# Patient Record
Sex: Female | Born: 1940 | Race: White | Hispanic: No | Marital: Single
Health system: Southern US, Community
[De-identification: ages and names within clinical notes are randomized; demographics above are authoritative.]

---

## 2021-01-05 ENCOUNTER — Observation Stay (HOSPITAL_COMMUNITY)
Admission: EM | Admit: 2021-01-05 | Discharge: 2021-01-06 | Disposition: A | Discharge status: Home / Self Care | Payer: Medicare Other | Attending: Internal Medicine | Admitting: Internal Medicine

## 2021-01-05 ENCOUNTER — Emergency Department (HOSPITAL_COMMUNITY): Payer: Medicare Other

## 2021-01-05 ENCOUNTER — Encounter (HOSPITAL_COMMUNITY): Payer: Self-pay

## 2021-01-05 DIAGNOSIS — W010XXA Fall on same level from slipping, tripping and stumbling without subsequent striking against object, initial encounter: Secondary | ICD-10-CM | POA: Insufficient documentation

## 2021-01-05 DIAGNOSIS — E039 Hypothyroidism, unspecified: Secondary | ICD-10-CM | POA: Diagnosis not present

## 2021-01-05 DIAGNOSIS — Y900 Blood alcohol level of less than 20 mg/100 ml: Secondary | ICD-10-CM | POA: Diagnosis not present

## 2021-01-05 DIAGNOSIS — K915 Postcholecystectomy syndrome: Secondary | ICD-10-CM | POA: Diagnosis not present

## 2021-01-05 DIAGNOSIS — S06369A Traumatic hemorrhage of cerebrum, unspecified, with loss of consciousness of unspecified duration, initial encounter: Secondary | ICD-10-CM | POA: Diagnosis not present

## 2021-01-05 DIAGNOSIS — Z515 Encounter for palliative care: Secondary | ICD-10-CM

## 2021-01-05 DIAGNOSIS — R402111 Coma scale, eyes open, never, in the field [EMT or ambulance]: Secondary | ICD-10-CM | POA: Diagnosis present

## 2021-01-05 DIAGNOSIS — S06349A Traumatic hemorrhage of right cerebrum with loss of consciousness of unspecified duration, initial encounter: Secondary | ICD-10-CM

## 2021-01-05 DIAGNOSIS — S0633AA Contusion and laceration of cerebrum, unspecified, with loss of consciousness status unknown, initial encounter: Secondary | ICD-10-CM

## 2021-01-05 DIAGNOSIS — N3941 Urge incontinence: Secondary | ICD-10-CM | POA: Insufficient documentation

## 2021-01-05 DIAGNOSIS — J9811 Atelectasis: Secondary | ICD-10-CM | POA: Diagnosis not present

## 2021-01-05 DIAGNOSIS — S065X9A Traumatic subdural hemorrhage with loss of consciousness of unspecified duration, initial encounter: Secondary | ICD-10-CM

## 2021-01-05 DIAGNOSIS — Z20822 Contact with and (suspected) exposure to covid-19: Secondary | ICD-10-CM | POA: Insufficient documentation

## 2021-01-05 DIAGNOSIS — S06A1XA Traumatic brain compression with herniation, initial encounter: Secondary | ICD-10-CM | POA: Insufficient documentation

## 2021-01-05 DIAGNOSIS — I609 Nontraumatic subarachnoid hemorrhage, unspecified: Secondary | ICD-10-CM

## 2021-01-05 DIAGNOSIS — S065XAA Traumatic subdural hemorrhage with loss of consciousness status unknown, initial encounter: Secondary | ICD-10-CM

## 2021-01-05 DIAGNOSIS — S06360A Traumatic hemorrhage of cerebrum, unspecified, without loss of consciousness, initial encounter: Secondary | ICD-10-CM

## 2021-01-05 DIAGNOSIS — T1490XA Injury, unspecified, initial encounter: Secondary | ICD-10-CM

## 2021-01-05 DIAGNOSIS — I629 Nontraumatic intracranial hemorrhage, unspecified: Secondary | ICD-10-CM | POA: Diagnosis not present

## 2021-01-05 DIAGNOSIS — S0990XA Unspecified injury of head, initial encounter: Secondary | ICD-10-CM | POA: Diagnosis present

## 2021-01-05 DIAGNOSIS — W19XXXA Unspecified fall, initial encounter: Secondary | ICD-10-CM

## 2021-01-05 DIAGNOSIS — S06319A Contusion and laceration of right cerebrum with loss of consciousness of unspecified duration, initial encounter: Secondary | ICD-10-CM

## 2021-01-05 LAB — I-STAT CHEM 8, ED
BUN: 18 mg/dL (ref 8–23)
Calcium, Ion: 0.93 mmol/L — ABNORMAL LOW (ref 1.15–1.40)
Chloride: 102 mmol/L (ref 98–111)
Creatinine, Ser: 0.7 mg/dL (ref 0.44–1.00)
Glucose, Bld: 297 mg/dL — ABNORMAL HIGH (ref 70–99)
HCT: 35 % — ABNORMAL LOW (ref 36.0–46.0)
Hemoglobin: 11.9 g/dL — ABNORMAL LOW (ref 12.0–15.0)
Potassium: 4.2 mmol/L (ref 3.5–5.1)
Sodium: 131 mmol/L — ABNORMAL LOW (ref 135–145)
TCO2: 18 mmol/L — ABNORMAL LOW (ref 22–32)

## 2021-01-05 LAB — CBC
HCT: 36.1 % (ref 36.0–46.0)
Hemoglobin: 12 g/dL (ref 12.0–15.0)
MCH: 31.8 pg (ref 26.0–34.0)
MCHC: 33.2 g/dL (ref 30.0–36.0)
MCV: 95.8 fL (ref 80.0–100.0)
Platelets: 343 10*3/uL (ref 150–400)
RBC: 3.77 MIL/uL — ABNORMAL LOW (ref 3.87–5.11)
RDW: 13.2 % (ref 11.5–15.5)
WBC: 18.1 10*3/uL — ABNORMAL HIGH (ref 4.0–10.5)
nRBC: 0 % (ref 0.0–0.2)

## 2021-01-05 LAB — COMPREHENSIVE METABOLIC PANEL
ALT: 37 U/L (ref 0–44)
AST: 55 U/L — ABNORMAL HIGH (ref 15–41)
Albumin: 3.5 g/dL (ref 3.5–5.0)
Alkaline Phosphatase: 51 U/L (ref 38–126)
Anion gap: 15 (ref 5–15)
BUN: 17 mg/dL (ref 8–23)
CO2: 18 mmol/L — ABNORMAL LOW (ref 22–32)
Calcium: 8.1 mg/dL — ABNORMAL LOW (ref 8.9–10.3)
Chloride: 98 mmol/L (ref 98–111)
Creatinine, Ser: 1.01 mg/dL — ABNORMAL HIGH (ref 0.44–1.00)
GFR, Estimated: 56 mL/min — ABNORMAL LOW (ref >60)
Glucose, Bld: 294 mg/dL — ABNORMAL HIGH (ref 70–99)
Potassium: 4.2 mmol/L (ref 3.5–5.1)
Sodium: 131 mmol/L — ABNORMAL LOW (ref 135–145)
Total Bilirubin: 0.8 mg/dL (ref 0.3–1.2)
Total Protein: 6.2 g/dL — ABNORMAL LOW (ref 6.5–8.1)

## 2021-01-05 LAB — RESP PANEL BY RT-PCR (FLU A&B, COVID) ARPGX2
Influenza A by PCR: NEGATIVE
Influenza B by PCR: NEGATIVE
SARS Coronavirus 2 by RT PCR: NEGATIVE

## 2021-01-05 LAB — I-STAT ARTERIAL BLOOD GAS, ED
Acid-base deficit: 5 mmol/L — ABNORMAL HIGH (ref 0.0–2.0)
Bicarbonate: 18.2 mmol/L — ABNORMAL LOW (ref 20.0–28.0)
Calcium, Ion: 1.07 mmol/L — ABNORMAL LOW (ref 1.15–1.40)
HCT: 28 % — ABNORMAL LOW (ref 36.0–46.0)
Hemoglobin: 9.5 g/dL — ABNORMAL LOW (ref 12.0–15.0)
O2 Saturation: 100 %
Patient temperature: 101.6
Potassium: 3.8 mmol/L (ref 3.5–5.1)
Sodium: 131 mmol/L — ABNORMAL LOW (ref 135–145)
TCO2: 19 mmol/L — ABNORMAL LOW (ref 22–32)
pCO2 arterial: 27.7 mmHg — ABNORMAL LOW (ref 32.0–48.0)
pH, Arterial: 7.432 (ref 7.350–7.450)
pO2, Arterial: 226 mmHg — ABNORMAL HIGH (ref 83.0–108.0)

## 2021-01-05 LAB — SAMPLE TO BLOOD BANK

## 2021-01-05 LAB — ETHANOL: Alcohol, Ethyl (B): <10 mg/dL (ref <10)

## 2021-01-05 LAB — LACTIC ACID, PLASMA: Lactic Acid, Venous: 4.5 mmol/L (ref 0.5–1.9)

## 2021-01-05 LAB — PROTIME-INR
INR: 1.2 (ref 0.8–1.2)
Prothrombin Time: 15.5 seconds — ABNORMAL HIGH (ref 11.4–15.2)

## 2021-01-05 MED ORDER — HYDROMORPHONE BOLUS VIA INFUSION
0.5000 mg | INTRAVENOUS | Status: DC | PRN
  Filled 2021-01-05: qty 1

## 2021-01-05 MED ORDER — SODIUM CHLORIDE 0.9 % IV SOLN
1.0000 mg/h | INTRAVENOUS | Status: DC
  Administered 2021-01-05: 1 mg/h via INTRAVENOUS
  Filled 2021-01-05: qty 5

## 2021-01-05 MED ORDER — LEVETIRACETAM IN NACL 1500 MG/100ML IV SOLN
1500.0000 mg | Freq: Once | INTRAVENOUS | Status: AC
  Administered 2021-01-05: 1500 mg via INTRAVENOUS
  Filled 2021-01-05: qty 100

## 2021-01-05 MED ORDER — DEXAMETHASONE SODIUM PHOSPHATE 10 MG/ML IJ SOLN
10.0000 mg | Freq: Once | INTRAMUSCULAR | Status: DC
  Filled 2021-01-05: qty 1

## 2021-01-05 MED ORDER — BIOTENE DRY MOUTH MT LIQD: 15.0000 mL | OROMUCOSAL | Status: DC | PRN

## 2021-01-05 MED ORDER — PROPOFOL 1000 MG/100ML IV EMUL
0.0000 ug/kg/min | INTRAVENOUS | Status: DC
Start: 2021-01-05 — End: 2021-01-05
  Administered 2021-01-05: 20 ug/kg/min via INTRAVENOUS

## 2021-01-05 MED ORDER — CLEVIDIPINE BUTYRATE 0.5 MG/ML IV EMUL
0.0000 mg/h | INTRAVENOUS | Status: DC
  Administered 2021-01-05: 2 mg/h via INTRAVENOUS
  Filled 2021-01-05: qty 50

## 2021-01-05 MED ORDER — PROPOFOL 1000 MG/100ML IV EMUL
INTRAVENOUS | Status: AC
  Filled 2021-01-05: qty 100

## 2021-01-05 MED ORDER — PROPOFOL 1000 MG/100ML IV EMUL
INTRAVENOUS | Status: DC | PRN
  Administered 2021-01-05: 20 ug/kg/min via INTRAVENOUS

## 2021-01-05 MED ORDER — ETOMIDATE 2 MG/ML IV SOLN
INTRAVENOUS | Status: AC | PRN
  Administered 2021-01-05: 20 mg via INTRAVENOUS

## 2021-01-05 MED ORDER — GLYCOPYRROLATE 1 MG PO TABS
1.0000 mg | ORAL_TABLET | ORAL | Status: DC | PRN
  Filled 2021-01-05: qty 1

## 2021-01-05 MED ORDER — GLYCOPYRROLATE 0.2 MG/ML IJ SOLN: 0.2000 mg | INTRAMUSCULAR | Status: DC | PRN

## 2021-01-05 MED ORDER — IOHEXOL 300 MG/ML  SOLN
100.0000 mL | Freq: Once | INTRAMUSCULAR | Status: AC | PRN
  Administered 2021-01-05: 100 mL via INTRAVENOUS

## 2021-01-05 MED ORDER — SUCCINYLCHOLINE CHLORIDE 20 MG/ML IJ SOLN
INTRAMUSCULAR | Status: AC | PRN
  Administered 2021-01-05: 80 mg via INTRAVENOUS

## 2021-01-05 NOTE — Progress Notes (Signed)
Pt. Extubated to comfort care per MD order.

## 2021-01-05 NOTE — ED Notes (Signed)
Transition of Care [facility]) - CAGE-AID Screening   Patient Details  Name: Terri Jacobson MRN: [id number] Date of Birth: [birth year]       CAGE-AID Screening: Substance Abuse Screening unable to be completed due to: : Patient unable to participate (pt unable to participate due to medical condition)

## 2021-01-05 NOTE — ED Provider Notes (Signed)
Emergency Department Provider Note   I have reviewed the triage vital signs and the nursing notes.   HISTORY  Chief Complaint Fall   HPI Terri Jacobson is a 80 y.o. female arrives to the emergency department as a level 1 trauma activated from the field.  Patient reportedly had a mechanical fall witnessed by neighbors today at 1 PM.  She refused transport to the hospital and went back inside.  Her son apparently came to check on her later in the day and found her on the floor and minimally responsive.  EMS arrived on scene to find the patient with GCS of 3, unequal pupils, and periorbital ecchymosis.  She was not responding to pain but did have spontaneous respirations.  She was placed on a nonrebreather and brought emergently to the emergency department. Unknown anticoagulation status.   Level 5 caveat: Unresponsive.   History reviewed. No pertinent past medical history.  Patient Active Problem List   Diagnosis Date Noted   Intracranial hemorrhage (HCC) 14-Jan-2021    History reviewed. No pertinent surgical history.  Allergies Patient has no known allergies.  No family history on file.  Social History    Review of Systems  Level 5 caveat: Unresponsive.   ____________________________________________   PHYSICAL EXAM:  VITAL SIGNS: ED Triage Vitals  Enc Vitals Group     BP 2021-01-14 2024 (!) 188/100     Pulse Rate Jan 14, 2021 2029 (!) 114     Resp 2021/01/14 2029 19     Temp 2021/01/14 2031 99.9 F (37.7 C)     Temp src --      SpO2 01/14/21 2029 98 %     Weight 01/14/21 2030 121 lb 4.1 oz (55 kg)     Height 01/14/21 2030  (1.626 m)   Constitutional: Unresponsive to painful stimulus.  Eyes: Conjunctivae are normal. Pupils are unequal. 4 mm on the right and 2 mm on the left (sluggish).  Bilateral periorbital ecchymosis and swelling noted.  Head: No appreciable lacerations but a large left occipital scalp hematoma noted.  Nose: No  congestion/rhinnorhea. Mouth/Throat: Mucous membranes are moist.  Oropharynx non-erythematous. No bleeding. No dentures.  Neck: C collar in place.  Cardiovascular: Tachycarida. Good peripheral circulation. Grossly normal heart sounds.   Respiratory: Agonal respiratory effort.  No retractions. Lungs CTAB. Gastrointestinal: Soft. No distention. Stable pelvis.  Musculoskeletal: No gross deformities of extremities. Neurologic: Unresponsive. GCS 3.  Skin:  Skin is warm and dry. Abrasion to the left shoulder and left forearm. Wounds are hemostatic.    ____________________________________________   LABS (all labs ordered are listed, but only abnormal results are displayed)  Labs Reviewed  COMPREHENSIVE METABOLIC PANEL - Abnormal; Notable for the following components:      Result Value   Sodium 131 (*)    CO2 18 (*)    Glucose, Bld 294 (*)    Creatinine, Ser 1.01 (*)    Calcium 8.1 (*)    Total Protein 6.2 (*)    AST 55 (*)    GFR, Estimated 56 (*)    All other components within normal limits  CBC - Abnormal; Notable for the following components:   WBC 18.1 (*)    RBC 3.77 (*)    All other components within normal limits  LACTIC ACID, PLASMA - Abnormal; Notable for the following components:   Lactic Acid, Venous 4.5 (*)    All other components within normal limits  PROTIME-INR - Abnormal; Notable for the following components:   Prothrombin  Time 15.5 (*)    All other components within normal limits  I-STAT CHEM 8, ED - Abnormal; Notable for the following components:   Sodium 131 (*)    Glucose, Bld 297 (*)    Calcium, Ion 0.93 (*)    TCO2 18 (*)    Hemoglobin 11.9 (*)    HCT 35.0 (*)    All other components within normal limits  I-STAT ARTERIAL BLOOD GAS, ED - Abnormal; Notable for the following components:   pCO2 arterial 27.7 (*)    pO2, Arterial 226 (*)    Bicarbonate 18.2 (*)    TCO2 19 (*)    Acid-base deficit 5.0 (*)    Sodium 131 (*)    Calcium, Ion 1.07 (*)    HCT  28.0 (*)    Hemoglobin 9.5 (*)    All other components within normal limits  RESP PANEL BY RT-PCR (FLU A&B, COVID) ARPGX2  ETHANOL  URINALYSIS, ROUTINE W REFLEX MICROSCOPIC  TRIGLYCERIDES  SAMPLE TO BLOOD BANK   ____________________________________________  RADIOLOGY  CT HEAD WO CONTRAST  Result Date: 01-14-2021 CLINICAL DATA:  Polytrauma, critical, head/C-spine injury suspected. Fall, head injury, found down, unresponsive EXAM: CT HEAD WITHOUT CONTRAST CT MAXILLOFACIAL WITHOUT CONTRAST CT CERVICAL SPINE WITHOUT CONTRAST CT CHEST, ABDOMEN AND PELVIS WITH CONTRAST TECHNIQUE: Contiguous axial images were obtained from the base of the skull through the vertex without intravenous contrast. Multidetector CT imaging of the maxillofacial structures was performed. Multiplanar CT image reconstructions were also generated. A small metallic BB was placed on the right temple in order to reliably differentiate right from left. Multidetector CT imaging of the cervical spine was performed without intravenous contrast. Multiplanar CT image reconstructions were also generated. Multidetector CT imaging of the chest, abdomen and pelvis was performed following the standard protocol during bolus administration of intravenous contrast. CONTRAST:  OMNIPAQUE IOHEXOL 300 MG/ML  SOLN COMPARISON:  None. FINDINGS: CT HEAD FINDINGS Brain: There is a large right intraparenchymal hematoma involving the a right temporal lobe anteriorly measuring at least 7.2 x 4.3 x 5.1 cm demonstrating significant mass effect with complete effacement of the right lateral ventricle, 14 mm right to left midline shift, and herniation of the uncus below the tentorium, best appreciated on coronal image # 37. There is significant mass effect upon the midbrain, best appreciated on axial image # 17/3. Smaller 12 mm intraparenchymal hematoma is seen involving the anterior pole of the left temporal lobe. There is extensive subarachnoid hemorrhage noted  bilaterally within the sulci of the cerebral hemispheres. Bilateral subdural hematoma overlie the cerebral convexities. On the right, this measures up to 13 mm in thickness (coronal image # 24/5), layers along the right tentorium, and demonstrates additional mass effect upon the right cerebral hemisphere. On the left, this is relatively thin measuring up to 3 mm in thickness. Left lateral ventricle is of normal size. There is marked mass effect with near complete effacement of the third ventricle. Fourth ventricle is unremarkable. Complete effacement of the suprasellar cistern. Cerebellum is unremarkable. Vascular: No asymmetric hyperdense vasculature at the skull base. Skull: The calvarium is intact. Other: There is a a moderate left parietal scalp hematoma noted. Mastoid air cells and middle ear cavities are clear. CT MAXILLOFACIAL FINDINGS Osseous: No acute facial fracture.  No mandibular dislocation. Orbits: The ocular globes are intact. The ocular lenses are removed bilaterally. The retro-orbital fat is preserved. Extraocular musculature and optic nerves appear normal. There is moderate left preseptal soft tissue swelling. Sinuses: There is  layering high attenuation fluid within the left maxillary sinus and sphenoid sinuses in keeping with blood. Soft tissues: Unremarkable. CT CERVICAL SPINE FINDINGS Alignment: Normal.  No listhesis. Skull base and vertebrae: Craniocervical alignment is normal. The atlantodental interval is not widened. There is no acute fracture of the cervical spine. Vertebral body height is preserved. Soft tissues and spinal canal: No prevertebral fluid or swelling. No visible canal hematoma. Disc levels: There is intervertebral disc space narrowing and endplate remodeling at C5-C7 in keeping with changes of moderate degenerative disc disease. Remaining intervertebral disc heights are preserved. The prevertebral soft tissues are not thickened on sagittal reformats. Multilevel uncovertebral  and facet arthrosis results in multilevel mild-to-moderate neuroforaminal narrowing, most severe on the left at C4-5 and bilaterally at C5-6 Other: None CT CHEST FINDINGS Cardiovascular: No significant coronary artery calcification. Global cardiac size within normal limits. No pericardial effusion. Central pulmonary arteries are of normal caliber. Thoracic aorta is unremarkable. Mediastinum/Nodes: Endotracheal tube in expected position within the distal trachea. Nasogastric tube extends into the mid body of the stomach. No pathologic adenopathy within the thorax. No mediastinal hematoma. Esophagus unremarkable. Lungs/Pleura: Mild bibasilar atelectasis. Lungs are otherwise clear. No pneumothorax or pleural effusion. Musculoskeletal: Degenerative changes noted within the shoulders. Multiple healed right rib fractures noted. No acute bone abnormality within the thorax. CT ABDOMEN AND PELVIS FINDINGS Hepatobiliary: Status post cholecystectomy. Moderate intra and extrahepatic biliary ductal dilation may represent post cholecystectomy change, and is stable since remote prior examination of 10/15/2019. Liver unremarkable. Pancreas: Unremarkable Spleen: Unremarkable Adrenals/Urinary Tract: Adrenal glands are unremarkable. Kidneys are normal, without renal calculi, focal lesion, or hydronephrosis. Bladder is unremarkable. Stomach/Bowel: The stomach, small bowel, and large bowel are unremarkable. No free intraperitoneal gas or fluid. Vascular/Lymphatic: Retroaortic left renal vein. Mild atherosclerotic calcification within the abdominal aorta. No aortic aneurysm. No pathologic adenopathy within the abdomen and pelvis. Reproductive: Uterus and bilateral adnexa are unremarkable. Other: No abdominal wall hernia Musculoskeletal: Degenerative changes are seen within the lumbar spine. Remote compression fracture of L2 status post vertebroplasty noted. No acute bone abnormality within the abdomen and pelvis. IMPRESSION: Probable  Coup/contrecoup type injury with direct impact and moderate hematoma involving the left parietal scalp. Large intraparenchymal hematoma within the right temporal lobe measuring up to 7.2 cm in size demonstrating marked mass effect with 14 mm right to left midline shift, herniation of the uncus with effacement of the suprasellar cistern and marked mass effect upon the midbrain, complete effacement of the right lateral ventricle and near complete effacement of the third ventricle. Superimposed bilateral subdural hematoma measuring up to 13 mm in thickness on the right contributing to mass effect upon the right cerebral hemisphere. Small left subdural hematoma measures up to 3 mm in thickness without associated significant mass effect. 12 mm intraparenchymal hematoma within the anterior pole of the left temporal lobe. Extensive subarachnoid hemorrhage. Layering hemorrhage within the paranasal sinuses. No acute facial fracture identified, however. No acute fracture or listhesis of the cervical spine. No acute intrathoracic or intra-abdominal injury. These results were called by telephone at the time of interpretation on 01/18/2021 at 9:20 pm to provider Amiir Heckard , who verbally acknowledged these results. Electronically Signed   By: Helyn Numbers MD   On: 01/14/2021 21:39   CT CHEST W CONTRAST  Result Date: 01/28/2021 CLINICAL DATA:  Polytrauma, critical, head/C-spine injury suspected. Fall, head injury, found down, unresponsive EXAM: CT HEAD WITHOUT CONTRAST CT MAXILLOFACIAL WITHOUT CONTRAST CT CERVICAL SPINE WITHOUT CONTRAST CT CHEST, ABDOMEN AND PELVIS  WITH CONTRAST TECHNIQUE: Contiguous axial images were obtained from the base of the skull through the vertex without intravenous contrast. Multidetector CT imaging of the maxillofacial structures was performed. Multiplanar CT image reconstructions were also generated. A small metallic BB was placed on the right temple in order to reliably differentiate right from  left. Multidetector CT imaging of the cervical spine was performed without intravenous contrast. Multiplanar CT image reconstructions were also generated. Multidetector CT imaging of the chest, abdomen and pelvis was performed following the standard protocol during bolus administration of intravenous contrast. CONTRAST:  OMNIPAQUE IOHEXOL 300 MG/ML  SOLN COMPARISON:  None. FINDINGS: CT HEAD FINDINGS Brain: There is a large right intraparenchymal hematoma involving the a right temporal lobe anteriorly measuring at least 7.2 x 4.3 x 5.1 cm demonstrating significant mass effect with complete effacement of the right lateral ventricle, 14 mm right to left midline shift, and herniation of the uncus below the tentorium, best appreciated on coronal image # 37. There is significant mass effect upon the midbrain, best appreciated on axial image # 17/3. Smaller 12 mm intraparenchymal hematoma is seen involving the anterior pole of the left temporal lobe. There is extensive subarachnoid hemorrhage noted bilaterally within the sulci of the cerebral hemispheres. Bilateral subdural hematoma overlie the cerebral convexities. On the right, this measures up to 13 mm in thickness (coronal image # 24/5), layers along the right tentorium, and demonstrates additional mass effect upon the right cerebral hemisphere. On the left, this is relatively thin measuring up to 3 mm in thickness. Left lateral ventricle is of normal size. There is marked mass effect with near complete effacement of the third ventricle. Fourth ventricle is unremarkable. Complete effacement of the suprasellar cistern. Cerebellum is unremarkable. Vascular: No asymmetric hyperdense vasculature at the skull base. Skull: The calvarium is intact. Other: There is a a moderate left parietal scalp hematoma noted. Mastoid air cells and middle ear cavities are clear. CT MAXILLOFACIAL FINDINGS Osseous: No acute facial fracture.  No mandibular dislocation. Orbits: The ocular  globes are intact. The ocular lenses are removed bilaterally. The retro-orbital fat is preserved. Extraocular musculature and optic nerves appear normal. There is moderate left preseptal soft tissue swelling. Sinuses: There is layering high attenuation fluid within the left maxillary sinus and sphenoid sinuses in keeping with blood. Soft tissues: Unremarkable. CT CERVICAL SPINE FINDINGS Alignment: Normal.  No listhesis. Skull base and vertebrae: Craniocervical alignment is normal. The atlantodental interval is not widened. There is no acute fracture of the cervical spine. Vertebral body height is preserved. Soft tissues and spinal canal: No prevertebral fluid or swelling. No visible canal hematoma. Disc levels: There is intervertebral disc space narrowing and endplate remodeling at C5-C7 in keeping with changes of moderate degenerative disc disease. Remaining intervertebral disc heights are preserved. The prevertebral soft tissues are not thickened on sagittal reformats. Multilevel uncovertebral and facet arthrosis results in multilevel mild-to-moderate neuroforaminal narrowing, most severe on the left at C4-5 and bilaterally at C5-6 Other: None CT CHEST FINDINGS Cardiovascular: No significant coronary artery calcification. Global cardiac size within normal limits. No pericardial effusion. Central pulmonary arteries are of normal caliber. Thoracic aorta is unremarkable. Mediastinum/Nodes: Endotracheal tube in expected position within the distal trachea. Nasogastric tube extends into the mid body of the stomach. No pathologic adenopathy within the thorax. No mediastinal hematoma. Esophagus unremarkable. Lungs/Pleura: Mild bibasilar atelectasis. Lungs are otherwise clear. No pneumothorax or pleural effusion. Musculoskeletal: Degenerative changes noted within the shoulders. Multiple healed right rib fractures noted. No acute  bone abnormality within the thorax. CT ABDOMEN AND PELVIS FINDINGS Hepatobiliary: Status post  cholecystectomy. Moderate intra and extrahepatic biliary ductal dilation may represent post cholecystectomy change, and is stable since remote prior examination of 10/15/2019. Liver unremarkable. Pancreas: Unremarkable Spleen: Unremarkable Adrenals/Urinary Tract: Adrenal glands are unremarkable. Kidneys are normal, without renal calculi, focal lesion, or hydronephrosis. Bladder is unremarkable. Stomach/Bowel: The stomach, small bowel, and large bowel are unremarkable. No free intraperitoneal gas or fluid. Vascular/Lymphatic: Retroaortic left renal vein. Mild atherosclerotic calcification within the abdominal aorta. No aortic aneurysm. No pathologic adenopathy within the abdomen and pelvis. Reproductive: Uterus and bilateral adnexa are unremarkable. Other: No abdominal wall hernia Musculoskeletal: Degenerative changes are seen within the lumbar spine. Remote compression fracture of L2 status post vertebroplasty noted. No acute bone abnormality within the abdomen and pelvis. IMPRESSION: Probable Coup/contrecoup type injury with direct impact and moderate hematoma involving the left parietal scalp. Large intraparenchymal hematoma within the right temporal lobe measuring up to 7.2 cm in size demonstrating marked mass effect with 14 mm right to left midline shift, herniation of the uncus with effacement of the suprasellar cistern and marked mass effect upon the midbrain, complete effacement of the right lateral ventricle and near complete effacement of the third ventricle. Superimposed bilateral subdural hematoma measuring up to 13 mm in thickness on the right contributing to mass effect upon the right cerebral hemisphere. Small left subdural hematoma measures up to 3 mm in thickness without associated significant mass effect. 12 mm intraparenchymal hematoma within the anterior pole of the left temporal lobe. Extensive subarachnoid hemorrhage. Layering hemorrhage within the paranasal sinuses. No acute facial fracture  identified, however. No acute fracture or listhesis of the cervical spine. No acute intrathoracic or intra-abdominal injury. These results were called by telephone at the time of interpretation on 01/29/2021 at 9:20 pm to provider Margareth Kanner , who verbally acknowledged these results. Electronically Signed   By: Helyn Numbers MD   On: 01/31/2021 21:39   CT CERVICAL SPINE WO CONTRAST  Result Date: 01/07/2021 CLINICAL DATA:  Polytrauma, critical, head/C-spine injury suspected. Fall, head injury, found down, unresponsive EXAM: CT HEAD WITHOUT CONTRAST CT MAXILLOFACIAL WITHOUT CONTRAST CT CERVICAL SPINE WITHOUT CONTRAST CT CHEST, ABDOMEN AND PELVIS WITH CONTRAST TECHNIQUE: Contiguous axial images were obtained from the base of the skull through the vertex without intravenous contrast. Multidetector CT imaging of the maxillofacial structures was performed. Multiplanar CT image reconstructions were also generated. A small metallic BB was placed on the right temple in order to reliably differentiate right from left. Multidetector CT imaging of the cervical spine was performed without intravenous contrast. Multiplanar CT image reconstructions were also generated. Multidetector CT imaging of the chest, abdomen and pelvis was performed following the standard protocol during bolus administration of intravenous contrast. CONTRAST:  OMNIPAQUE IOHEXOL 300 MG/ML  SOLN COMPARISON:  None. FINDINGS: CT HEAD FINDINGS Brain: There is a large right intraparenchymal hematoma involving the a right temporal lobe anteriorly measuring at least 7.2 x 4.3 x 5.1 cm demonstrating significant mass effect with complete effacement of the right lateral ventricle, 14 mm right to left midline shift, and herniation of the uncus below the tentorium, best appreciated on coronal image # 37. There is significant mass effect upon the midbrain, best appreciated on axial image # 17/3. Smaller 12 mm intraparenchymal hematoma is seen involving the  anterior pole of the left temporal lobe. There is extensive subarachnoid hemorrhage noted bilaterally within the sulci of the cerebral hemispheres. Bilateral subdural hematoma overlie the  cerebral convexities. On the right, this measures up to 13 mm in thickness (coronal image # 24/5), layers along the right tentorium, and demonstrates additional mass effect upon the right cerebral hemisphere. On the left, this is relatively thin measuring up to 3 mm in thickness. Left lateral ventricle is of normal size. There is marked mass effect with near complete effacement of the third ventricle. Fourth ventricle is unremarkable. Complete effacement of the suprasellar cistern. Cerebellum is unremarkable. Vascular: No asymmetric hyperdense vasculature at the skull base. Skull: The calvarium is intact. Other: There is a a moderate left parietal scalp hematoma noted. Mastoid air cells and middle ear cavities are clear. CT MAXILLOFACIAL FINDINGS Osseous: No acute facial fracture.  No mandibular dislocation. Orbits: The ocular globes are intact. The ocular lenses are removed bilaterally. The retro-orbital fat is preserved. Extraocular musculature and optic nerves appear normal. There is moderate left preseptal soft tissue swelling. Sinuses: There is layering high attenuation fluid within the left maxillary sinus and sphenoid sinuses in keeping with blood. Soft tissues: Unremarkable. CT CERVICAL SPINE FINDINGS Alignment: Normal.  No listhesis. Skull base and vertebrae: Craniocervical alignment is normal. The atlantodental interval is not widened. There is no acute fracture of the cervical spine. Vertebral body height is preserved. Soft tissues and spinal canal: No prevertebral fluid or swelling. No visible canal hematoma. Disc levels: There is intervertebral disc space narrowing and endplate remodeling at C5-C7 in keeping with changes of moderate degenerative disc disease. Remaining intervertebral disc heights are preserved. The  prevertebral soft tissues are not thickened on sagittal reformats. Multilevel uncovertebral and facet arthrosis results in multilevel mild-to-moderate neuroforaminal narrowing, most severe on the left at C4-5 and bilaterally at C5-6 Other: None CT CHEST FINDINGS Cardiovascular: No significant coronary artery calcification. Global cardiac size within normal limits. No pericardial effusion. Central pulmonary arteries are of normal caliber. Thoracic aorta is unremarkable. Mediastinum/Nodes: Endotracheal tube in expected position within the distal trachea. Nasogastric tube extends into the mid body of the stomach. No pathologic adenopathy within the thorax. No mediastinal hematoma. Esophagus unremarkable. Lungs/Pleura: Mild bibasilar atelectasis. Lungs are otherwise clear. No pneumothorax or pleural effusion. Musculoskeletal: Degenerative changes noted within the shoulders. Multiple healed right rib fractures noted. No acute bone abnormality within the thorax. CT ABDOMEN AND PELVIS FINDINGS Hepatobiliary: Status post cholecystectomy. Moderate intra and extrahepatic biliary ductal dilation may represent post cholecystectomy change, and is stable since remote prior examination of 10/15/2019. Liver unremarkable. Pancreas: Unremarkable Spleen: Unremarkable Adrenals/Urinary Tract: Adrenal glands are unremarkable. Kidneys are normal, without renal calculi, focal lesion, or hydronephrosis. Bladder is unremarkable. Stomach/Bowel: The stomach, small bowel, and large bowel are unremarkable. No free intraperitoneal gas or fluid. Vascular/Lymphatic: Retroaortic left renal vein. Mild atherosclerotic calcification within the abdominal aorta. No aortic aneurysm. No pathologic adenopathy within the abdomen and pelvis. Reproductive: Uterus and bilateral adnexa are unremarkable. Other: No abdominal wall hernia Musculoskeletal: Degenerative changes are seen within the lumbar spine. Remote compression fracture of L2 status post  vertebroplasty noted. No acute bone abnormality within the abdomen and pelvis. IMPRESSION: Probable Coup/contrecoup type injury with direct impact and moderate hematoma involving the left parietal scalp. Large intraparenchymal hematoma within the right temporal lobe measuring up to 7.2 cm in size demonstrating marked mass effect with 14 mm right to left midline shift, herniation of the uncus with effacement of the suprasellar cistern and marked mass effect upon the midbrain, complete effacement of the right lateral ventricle and near complete effacement of the third ventricle. Superimposed bilateral subdural hematoma measuring  up to 13 mm in thickness on the right contributing to mass effect upon the right cerebral hemisphere. Small left subdural hematoma measures up to 3 mm in thickness without associated significant mass effect. 12 mm intraparenchymal hematoma within the anterior pole of the left temporal lobe. Extensive subarachnoid hemorrhage. Layering hemorrhage within the paranasal sinuses. No acute facial fracture identified, however. No acute fracture or listhesis of the cervical spine. No acute intrathoracic or intra-abdominal injury. These results were called by telephone at the time of interpretation on January 28, 2021 at 9:20 pm to provider Dudley Mages , who verbally acknowledged these results. Electronically Signed   By: Helyn Numbers MD   On: 2021-01-28 21:39   CT ABDOMEN PELVIS W CONTRAST  Result Date: Jan 28, 2021 CLINICAL DATA:  Polytrauma, critical, head/C-spine injury suspected. Fall, head injury, found down, unresponsive EXAM: CT HEAD WITHOUT CONTRAST CT MAXILLOFACIAL WITHOUT CONTRAST CT CERVICAL SPINE WITHOUT CONTRAST CT CHEST, ABDOMEN AND PELVIS WITH CONTRAST TECHNIQUE: Contiguous axial images were obtained from the base of the skull through the vertex without intravenous contrast. Multidetector CT imaging of the maxillofacial structures was performed. Multiplanar CT image reconstructions were also  generated. A small metallic BB was placed on the right temple in order to reliably differentiate right from left. Multidetector CT imaging of the cervical spine was performed without intravenous contrast. Multiplanar CT image reconstructions were also generated. Multidetector CT imaging of the chest, abdomen and pelvis was performed following the standard protocol during bolus administration of intravenous contrast. CONTRAST:  OMNIPAQUE IOHEXOL 300 MG/ML  SOLN COMPARISON:  None. FINDINGS: CT HEAD FINDINGS Brain: There is a large right intraparenchymal hematoma involving the a right temporal lobe anteriorly measuring at least 7.2 x 4.3 x 5.1 cm demonstrating significant mass effect with complete effacement of the right lateral ventricle, 14 mm right to left midline shift, and herniation of the uncus below the tentorium, best appreciated on coronal image # 37. There is significant mass effect upon the midbrain, best appreciated on axial image # 17/3. Smaller 12 mm intraparenchymal hematoma is seen involving the anterior pole of the left temporal lobe. There is extensive subarachnoid hemorrhage noted bilaterally within the sulci of the cerebral hemispheres. Bilateral subdural hematoma overlie the cerebral convexities. On the right, this measures up to 13 mm in thickness (coronal image # 24/5), layers along the right tentorium, and demonstrates additional mass effect upon the right cerebral hemisphere. On the left, this is relatively thin measuring up to 3 mm in thickness. Left lateral ventricle is of normal size. There is marked mass effect with near complete effacement of the third ventricle. Fourth ventricle is unremarkable. Complete effacement of the suprasellar cistern. Cerebellum is unremarkable. Vascular: No asymmetric hyperdense vasculature at the skull base. Skull: The calvarium is intact. Other: There is a a moderate left parietal scalp hematoma noted. Mastoid air cells and middle ear cavities are clear.  CT MAXILLOFACIAL FINDINGS Osseous: No acute facial fracture.  No mandibular dislocation. Orbits: The ocular globes are intact. The ocular lenses are removed bilaterally. The retro-orbital fat is preserved. Extraocular musculature and optic nerves appear normal. There is moderate left preseptal soft tissue swelling. Sinuses: There is layering high attenuation fluid within the left maxillary sinus and sphenoid sinuses in keeping with blood. Soft tissues: Unremarkable. CT CERVICAL SPINE FINDINGS Alignment: Normal.  No listhesis. Skull base and vertebrae: Craniocervical alignment is normal. The atlantodental interval is not widened. There is no acute fracture of the cervical spine. Vertebral body height is preserved. Soft tissues and  spinal canal: No prevertebral fluid or swelling. No visible canal hematoma. Disc levels: There is intervertebral disc space narrowing and endplate remodeling at C5-C7 in keeping with changes of moderate degenerative disc disease. Remaining intervertebral disc heights are preserved. The prevertebral soft tissues are not thickened on sagittal reformats. Multilevel uncovertebral and facet arthrosis results in multilevel mild-to-moderate neuroforaminal narrowing, most severe on the left at C4-5 and bilaterally at C5-6 Other: None CT CHEST FINDINGS Cardiovascular: No significant coronary artery calcification. Global cardiac size within normal limits. No pericardial effusion. Central pulmonary arteries are of normal caliber. Thoracic aorta is unremarkable. Mediastinum/Nodes: Endotracheal tube in expected position within the distal trachea. Nasogastric tube extends into the mid body of the stomach. No pathologic adenopathy within the thorax. No mediastinal hematoma. Esophagus unremarkable. Lungs/Pleura: Mild bibasilar atelectasis. Lungs are otherwise clear. No pneumothorax or pleural effusion. Musculoskeletal: Degenerative changes noted within the shoulders. Multiple healed right rib fractures  noted. No acute bone abnormality within the thorax. CT ABDOMEN AND PELVIS FINDINGS Hepatobiliary: Status post cholecystectomy. Moderate intra and extrahepatic biliary ductal dilation may represent post cholecystectomy change, and is stable since remote prior examination of 10/15/2019. Liver unremarkable. Pancreas: Unremarkable Spleen: Unremarkable Adrenals/Urinary Tract: Adrenal glands are unremarkable. Kidneys are normal, without renal calculi, focal lesion, or hydronephrosis. Bladder is unremarkable. Stomach/Bowel: The stomach, small bowel, and large bowel are unremarkable. No free intraperitoneal gas or fluid. Vascular/Lymphatic: Retroaortic left renal vein. Mild atherosclerotic calcification within the abdominal aorta. No aortic aneurysm. No pathologic adenopathy within the abdomen and pelvis. Reproductive: Uterus and bilateral adnexa are unremarkable. Other: No abdominal wall hernia Musculoskeletal: Degenerative changes are seen within the lumbar spine. Remote compression fracture of L2 status post vertebroplasty noted. No acute bone abnormality within the abdomen and pelvis. IMPRESSION: Probable Coup/contrecoup type injury with direct impact and moderate hematoma involving the left parietal scalp. Large intraparenchymal hematoma within the right temporal lobe measuring up to 7.2 cm in size demonstrating marked mass effect with 14 mm right to left midline shift, herniation of the uncus with effacement of the suprasellar cistern and marked mass effect upon the midbrain, complete effacement of the right lateral ventricle and near complete effacement of the third ventricle. Superimposed bilateral subdural hematoma measuring up to 13 mm in thickness on the right contributing to mass effect upon the right cerebral hemisphere. Small left subdural hematoma measures up to 3 mm in thickness without associated significant mass effect. 12 mm intraparenchymal hematoma within the anterior pole of the left temporal lobe.  Extensive subarachnoid hemorrhage. Layering hemorrhage within the paranasal sinuses. No acute facial fracture identified, however. No acute fracture or listhesis of the cervical spine. No acute intrathoracic or intra-abdominal injury. These results were called by telephone at the time of interpretation on 01/07/2021 at 9:20 pm to provider Quatavious Rossa , who verbally acknowledged these results. Electronically Signed   By: Helyn NumbersAshesh  Parikh MD   On: Dec 31, 2020 21:39   DG Pelvis Portable  Result Date: 02/02/2021 CLINICAL DATA:  Ground level fall, found unresponsive EXAM: PORTABLE PELVIS 1-2 VIEWS COMPARISON:  10/15/2019 FINDINGS: Supine frontal view of the pelvis demonstrates no acute displaced fractures. The hips are well aligned. Joint spaces are well preserved. Chronic L2 compression deformity with evidence of previous vertebral augmentation. IMPRESSION: 1. No acute bony abnormality. Electronically Signed   By: Sharlet SalinaMichael  Brown M.D.   On: Dec 31, 2020 20:52   DG Chest Port 1 View  Result Date: 01/23/2021 CLINICAL DATA:  Fall.  Unresponsive. EXAM: PORTABLE CHEST 1 VIEW COMPARISON:  10/15/2019  CT from [facility]. Plain film 09/14/2017 from [facility] FINDINGS: Endotracheal tube terminates 2.0  cm above carina. Nasogastric tube extends beyond the  inferior aspect of the film. Remote posterior right rib fractures in the setting of osteopenia. High riding humeral heads, consistent with chronic rotator cuff insufficiency. Prior left rotator cuff repair and probable distal clavicular resection. Normal heart size. Right costophrenic angle minimally excluded. No pleural effusion or pneumothorax. Clear lungs. IMPRESSION: No acute cardiopulmonary disease. Electronically Signed   By: Jeronimo Greaves M.D.   On: 01/10/2021 20:51   CT Maxillofacial Wo Contrast  Result Date: 01/13/2021 CLINICAL DATA:  Polytrauma, critical, head/C-spine injury suspected. Fall, head injury, found down, unresponsive EXAM: CT HEAD WITHOUT  CONTRAST CT MAXILLOFACIAL WITHOUT CONTRAST CT CERVICAL SPINE WITHOUT CONTRAST CT CHEST, ABDOMEN AND PELVIS WITH CONTRAST TECHNIQUE: Contiguous axial images were obtained from the base of the skull through the vertex without intravenous contrast. Multidetector CT imaging of the maxillofacial structures was performed. Multiplanar CT image reconstructions were also generated. A small metallic BB was placed on the right temple in order to reliably differentiate right from left. Multidetector CT imaging of the cervical spine was performed without intravenous contrast. Multiplanar CT image reconstructions were also generated. Multidetector CT imaging of the chest, abdomen and pelvis was performed following the standard protocol during bolus administration of intravenous contrast. CONTRAST:  OMNIPAQUE IOHEXOL 300 MG/ML  SOLN COMPARISON:  None. FINDINGS: CT HEAD FINDINGS Brain: There is a large right intraparenchymal hematoma involving the a right temporal lobe anteriorly measuring at least 7.2 x 4.3 x 5.1 cm demonstrating significant mass effect with complete effacement of the right lateral ventricle, 14 mm right to left midline shift, and herniation of the uncus below the tentorium, best appreciated on coronal image # 37. There is significant mass effect upon the midbrain, best appreciated on axial image # 17/3. Smaller 12 mm intraparenchymal hematoma is seen involving the anterior pole of the left temporal lobe. There is extensive subarachnoid hemorrhage noted bilaterally within the sulci of the cerebral hemispheres. Bilateral subdural hematoma overlie the cerebral convexities. On the right, this measures up to 13 mm in thickness (coronal image # 24/5), layers along the right tentorium, and demonstrates additional mass effect upon the right cerebral hemisphere. On the left, this is relatively thin measuring up to 3 mm in thickness. Left lateral ventricle is of normal size. There is marked mass effect with near  complete effacement of the third ventricle. Fourth ventricle is unremarkable. Complete effacement of the suprasellar cistern. Cerebellum is unremarkable. Vascular: No asymmetric hyperdense vasculature at the skull base. Skull: The calvarium is intact. Other: There is a a moderate left parietal scalp hematoma noted. Mastoid air cells and middle ear cavities are clear. CT MAXILLOFACIAL FINDINGS Osseous: No acute facial fracture.  No mandibular dislocation. Orbits: The ocular globes are intact. The ocular lenses are removed bilaterally. The retro-orbital fat is preserved. Extraocular musculature and optic nerves appear normal. There is moderate left preseptal soft tissue swelling. Sinuses: There is layering high attenuation fluid within the left maxillary sinus and sphenoid sinuses in keeping with blood. Soft tissues: Unremarkable. CT CERVICAL SPINE FINDINGS Alignment: Normal.  No listhesis. Skull base and vertebrae: Craniocervical alignment is normal. The atlantodental interval is not widened. There is no acute fracture of the cervical spine. Vertebral body height is preserved. Soft tissues and spinal canal: No prevertebral fluid or swelling. No visible canal hematoma. Disc levels: There is intervertebral disc space narrowing and endplate remodeling  at C5-C7 in keeping with changes of moderate degenerative disc disease. Remaining intervertebral disc heights are preserved. The prevertebral soft tissues are not thickened on sagittal reformats. Multilevel uncovertebral and facet arthrosis results in multilevel mild-to-moderate neuroforaminal narrowing, most severe on the left at C4-5 and bilaterally at C5-6 Other: None CT CHEST FINDINGS Cardiovascular: No significant coronary artery calcification. Global cardiac size within normal limits. No pericardial effusion. Central pulmonary arteries are of normal caliber. Thoracic aorta is unremarkable. Mediastinum/Nodes: Endotracheal tube in expected position within the distal  trachea. Nasogastric tube extends into the mid body of the stomach. No pathologic adenopathy within the thorax. No mediastinal hematoma. Esophagus unremarkable. Lungs/Pleura: Mild bibasilar atelectasis. Lungs are otherwise clear. No pneumothorax or pleural effusion. Musculoskeletal: Degenerative changes noted within the shoulders. Multiple healed right rib fractures noted. No acute bone abnormality within the thorax. CT ABDOMEN AND PELVIS FINDINGS Hepatobiliary: Status post cholecystectomy. Moderate intra and extrahepatic biliary ductal dilation may represent post cholecystectomy change, and is stable since remote prior examination of 10/15/2019. Liver unremarkable. Pancreas: Unremarkable Spleen: Unremarkable Adrenals/Urinary Tract: Adrenal glands are unremarkable. Kidneys are normal, without renal calculi, focal lesion, or hydronephrosis. Bladder is unremarkable. Stomach/Bowel: The stomach, small bowel, and large bowel are unremarkable. No free intraperitoneal gas or fluid. Vascular/Lymphatic: Retroaortic left renal vein. Mild atherosclerotic calcification within the abdominal aorta. No aortic aneurysm. No pathologic adenopathy within the abdomen and pelvis. Reproductive: Uterus and bilateral adnexa are unremarkable. Other: No abdominal wall hernia Musculoskeletal: Degenerative changes are seen within the lumbar spine. Remote compression fracture of L2 status post vertebroplasty noted. No acute bone abnormality within the abdomen and pelvis. IMPRESSION: Probable Coup/contrecoup type injury with direct impact and moderate hematoma involving the left parietal scalp. Large intraparenchymal hematoma within the right temporal lobe measuring up to 7.2 cm in size demonstrating marked mass effect with 14 mm right to left midline shift, herniation of the uncus with effacement of the suprasellar cistern and marked mass effect upon the midbrain, complete effacement of the right lateral ventricle and near complete effacement  of the third ventricle. Superimposed bilateral subdural hematoma measuring up to 13 mm in thickness on the right contributing to mass effect upon the right cerebral hemisphere. Small left subdural hematoma measures up to 3 mm in thickness without associated significant mass effect. 12 mm intraparenchymal hematoma within the anterior pole of the left temporal lobe. Extensive subarachnoid hemorrhage. Layering hemorrhage within the paranasal sinuses. No acute facial fracture identified, however. No acute fracture or listhesis of the cervical spine. No acute intrathoracic or intra-abdominal injury. These results were called by telephone at the time of interpretation on 01/22/2021 at 9:20 pm to provider Elwood Bazinet , who verbally acknowledged these results. Electronically Signed   By: Helyn Numbers MD   On: 01/18/2021 21:39    ____________________________________________   PROCEDURES  Procedure(s) performed:   Procedure Name: Intubation Date/Time: 02/01/2021 9:01 PM Performed by: Maia Plan, MD Pre-anesthesia Checklist: Patient being monitored, Patient identified, Emergency Drugs available and Suction available Oxygen Delivery Method: Non-rebreather mask Preoxygenation: Pre-oxygenation with 100% oxygen Induction Type: Rapid sequence Laryngoscope Size: Glidescope and 3 Grade View: Grade II Tube size: 7.5 mm Number of attempts: 1 Airway Equipment and Method: Video-laryngoscopy Placement Confirmation: ETT inserted through vocal cords under direct vision, Positive ETCO2, CO2 detector and Breath sounds checked- equal and bilateral Secured at: 25 cm Tube secured with: ETT holder Dental Injury: Teeth and Oropharynx as per pre-operative assessment  Comments: Patient c-spine held during intubation and c-collar replaced.     Marland Kitchen  Critical Care  Date/Time: 01/16/21 11:05 PM Performed by: Maia Plan, MD Authorized by: Maia Plan, MD   Critical care provider statement:    Critical care time  (minutes):  75   Critical care time was exclusive of:  Separately billable procedures and treating other patients and teaching time   Critical care was necessary to treat or prevent imminent or life-threatening deterioration of the following conditions:  Respiratory failure and CNS failure or compromise   Critical care was time spent personally by me on the following activities:  Discussions with consultants, evaluation of patient's response to treatment, examination of patient, ordering and performing treatments and interventions, ordering and review of laboratory studies, ordering and review of radiographic studies, pulse oximetry, re-evaluation of patient's condition, obtaining history from patient or surrogate, review of old charts, blood draw for specimens, development of treatment plan with patient or surrogate and ventilator management   I assumed direction of critical care for this patient from another provider in my specialty: no     Care discussed with: admitting provider     ____________________________________________   INITIAL IMPRESSION / ASSESSMENT AND PLAN / ED COURSE  Pertinent labs & imaging results that were available during my care of the patient were reviewed by me and considered in my medical decision making (see chart for details).   Patient arrives as a level 1 trauma.  She has a GCS of 3 on arrival with clear head injuries and unequal pupils.  She was intubated for airway protection.  Exam is minimal.  Unknown anticoagulation status.  Intubated without complication and will go for pan scan imaging. Stable for transfer to CT. HOB elevated to 30 degrees.   09:12 PM  Large ICH with midline shift. Keppra, decadron, and Cleviprex ordered. NSG paged.   09:35 PM  Discussed the case with Dr. Jordan Likes on-call for neurosurgery.  He reviewed the case and CT images with me.  He does not have a surgical intervention to offer.  The patient has herniated and essentially completed her event  from earlier today.  He advises that this is not a survivable episode and would recommend withdrawal/comfort care from his perspective.  The patient's family has not arrived to the emergency department for goals of care discussion. No contact information in the chart. The patient's son was on scene and reportedly coming to the ED.   10:15 PM  Patient son arrived to the emergency department.  I discussed the grave and irreversible nature of his mother's injury.  He tells me that they were recently discussing end-of-life issues and he believes that she would not want to be resuscitated and certainly not be kept alive artificially with no hope of return to her normal self.  I discussed her injuries and my discussion with neurosurgery.  We discussed withdrawal of care and comfort measures.  He is considering this and would like to spend some time with her.  10:40 PM  Patient's son would like to more forward with terminal extubation and full comfort care. Have placed orders for dilaudid infusion and extubation. Will discontinue Propofol and BP meds.   Discussed case with TRH who can be available to admit should the patient not immediately pass and need additional comfort measures. I have discussed with the hospitalist that I am happy to complete the death certificate once the patient passes.   11:25 PM  Patient extubated. Breathing spontaneously with normal vitals initially. Dilaudid infusion running. Patient appears comfortable.  ____________________________________________  FINAL CLINICAL  IMPRESSION(S) / ED DIAGNOSES  Final diagnoses:  Intraparenchymal hematoma of brain, right, with loss of consciousness, initial encounter (HCC)  SAH (subarachnoid hemorrhage) (HCC)  SDH (subdural hematoma) (HCC)  Fall, initial encounter  Injury of head, initial encounter     MEDICATIONS GIVEN DURING THIS VISIT:  Medications  antiseptic oral rinse (BIOTENE) solution 15 mL (has no administration in time range)   glycopyrrolate (ROBINUL) tablet 1 mg (has no administration in time range)    Or  glycopyrrolate (ROBINUL) injection 0.2 mg (has no administration in time range)    Or  glycopyrrolate (ROBINUL) injection 0.2 mg (has no administration in time range)  HYDROmorphone (DILAUDID) bolus via infusion 0.5 mg (has no administration in time range)  HYDROmorphone (DILAUDID) 50 mg in sodium chloride 0.9 % 100 mL (0.5 mg/mL) infusion (1 mg/hr Intravenous New Bag/Given 02-04-2021 2303)  etomidate (AMIDATE) injection (20 mg Intravenous Given 04-Feb-2021 2025)  succinylcholine (ANECTINE) injection (80 mg Intravenous Given 02-04-21 2025)  iohexol (OMNIPAQUE) 300 MG/ML solution 100 mL (100 mLs Intravenous Contrast Given 02/04/2021 2101)  levETIRAcetam (KEPPRA) IVPB 1500 mg/ 100 mL premix (0 mg Intravenous Stopped 02-04-21 2135)    Note:  This document was prepared using Dragon voice recognition software and may include unintentional dictation errors.  Alona Bene, MD, [facility]    Alleya Demeter, Arlyss Repress, MD 02/04/2021 [id number]

## 2021-01-05 NOTE — ED Notes (Signed)
Pt comes via [facility], pt had a ground level fall this afternoon around 1pm outside, witnessed by neighbors, refused treatment, son attempted to call pt and went over, found unresponsive inside the house, GCS 3, unequal pupils, raccoon eyes

## 2021-01-05 NOTE — ED Notes (Signed)
Trauma Response Nurse Note-  Reason for Call / Reason for Trauma activation:   -Level 1 trauma, fall, GCS 3 with unequal pupils  Initial Focused Assessment (If applicable, or please see trauma documentation):  -Pt came in with c-collar in place, on a non-rebreather. No airway obstruction noted, but pt was intubated on arrival by EDP. Pt noted to have symmetrical chest rise and fall. No external hemorrhage noted. Pt noted to have bilateral bruising to the eyes and abrasion to the left shoulder.   Interventions:  -Pt had chest and pelvis x-ray. Pt was intubated and OG tube placed. Pt taken to CT with primary RN and TRN along with trauma provider. Pt is back from CT, but delay to CT due to waiting on respiratory therapy. RT was in another emergency. Primary RN called charge RTx2 for extra help.   Plan of Care as of this note:  -Waiting on official scan results.   Event Summary:   -Pt came in as a level 1 trauma, fall, with raccoon eyes, GCS of 3. Pt was intubated on arrival by EDP. Blood work obtained and trauma assessment completed. X-rays obtained and propofol started. Pt taken to CT once RT was available. Pt back in trauma bay, on cardiac monitor and pure wick.   The Following (if applicable):    -MD notified: Dr. Jacqulyn Bath and Dr. Dossie Der, both at bedside prior to patient arrival    -TRN arrival Time: TRN at bedside prior to patients arrival

## 2021-01-05 NOTE — H&P (Signed)
Admitting Physician: Hyman Hopes Kapri Nero  Service: Trauma Surgery  CC: Fall  Subjective   Mechanism of Injury: Terri Jacobson is an 80 y.o. female who presented as a level 1 trauma after a fall.  Terri Jacobson fell earlier today which was witnessed, however she said she was fine did not request any help and return to her house.  When her family came to check on her later, she was unresponsive so she was brought into the ER by EMS with a GCS of 3.  Unable to obtain past medical history, past surgical history, allergies, medications, social history, or family history due to the patient's mental status.   Objective   Primary Survey: Blood pressure (!) 158/90, pulse (!) 116, temperature 99.9 F (37.7 C), resp. rate (!) 27, height 5\' 4"  (1.626 m), weight 55 kg, SpO2 100 %. Airway:  Does not appear to be protecting airway Breathing: Bilateral breath sounds Circulation: Stable, palpable peripheral pulses Disability: GCS of 3, very stiff extremities, no response to pain. Environment/Exposure: Warm, dry  Primary Survey Adjuncts:  CXR - See results below PXR - See results below  Secondary Survey: Head:  Cephalhematoma Neck:  C-collar in place Chest: Bruising over right posterior chest wall Abdomen: Soft, nondistended Upper Extremities: Stiff, palpable peripheral pulses, no spontaneous movement Lower extremities: Stiff, palpable peripheral pulses, no spontaneous movement Back: No step-offs or deformities Rectal: Tone intact, no blood in rectal vault on digital rectal exam Psych: Altered  Results for orders placed or performed during the hospital encounter of 01/29/2021 (from the past 24 hour(s))  Comprehensive metabolic panel     Status: Abnormal   Collection Time: 01/08/2021  8:27 PM  Result Value Ref Range   Sodium 131 (L) 135 - 145 mmol/L   Potassium 4.2 3.5 - 5.1 mmol/L   Chloride 98 98 - 111 mmol/L   CO2 18 (L) 22 - 32 mmol/L   Glucose, Bld 294 (H) 70 - 99 mg/dL   BUN 17 8 - 23  mg/dL   Creatinine, Ser 03/07/21 (H) 0.44 - 1.00 mg/dL   Calcium 8.1 (L) 8.9 - 10.3 mg/dL   Total Protein 6.2 (L) 6.5 - 8.1 g/dL   Albumin 3.5 3.5 - 5.0 g/dL   AST 55 (H) 15 - 41 U/L   ALT 37 0 - 44 U/L   Alkaline Phosphatase 51 38 - 126 U/L   Total Bilirubin 0.8 0.3 - 1.2 mg/dL   GFR, Estimated 56 (L) >60 mL/min   Anion gap 15 5 - 15  CBC     Status: Abnormal   Collection Time: 01/23/2021  8:27 PM  Result Value Ref Range   WBC 18.1 (H) 4.0 - 10.5 K/uL   RBC 3.77 (L) 3.87 - 5.11 MIL/uL   Hemoglobin 12.0 12.0 - 15.0 g/dL   HCT 03/07/21 00.8 - 67.6 %   MCV 95.8 80.0 - 100.0 fL   MCH 31.8 26.0 - 34.0 pg   MCHC 33.2 30.0 - 36.0 g/dL   RDW 19.5 09.3 - 26.7 %   Platelets 343 150 - 400 K/uL   nRBC 0.0 0.0 - 0.2 %  Ethanol     Status: None   Collection Time: 01/18/2021  8:27 PM  Result Value Ref Range   Alcohol, Ethyl (B) <10 <10 mg/dL  Lactic acid, plasma     Status: Abnormal   Collection Time: 01/24/2021  8:27 PM  Result Value Ref Range   Lactic Acid, Venous 4.5 (HH) 0.5 - 1.9 mmol/L  Protime-INR     Status: Abnormal   Collection Time: January 11, 2021  8:27 PM  Result Value Ref Range   Prothrombin Time 15.5 (H) 11.4 - 15.2 seconds   INR 1.2 0.8 - 1.2  Sample to Blood Bank     Status: None   Collection Time: 2021/01/11  8:27 PM  Result Value Ref Range   Blood Bank Specimen SAMPLE AVAILABLE FOR TESTING    Sample Expiration      01/21/2021,2359 Performed at [facility], [address] [place], [place] [address]   I-Stat Chem 8, ED     Status: Abnormal   Collection Time: 01/11/21  8:37 PM  Result Value Ref Range   Sodium 131 (L) 135 - 145 mmol/L   Potassium 4.2 3.5 - 5.1 mmol/L   Chloride 102 98 - 111 mmol/L   BUN 18 8 - 23 mg/dL   Creatinine, Ser 4.09 0.44 - 1.00 mg/dL   Glucose, Bld 735 (H) 70 - 99 mg/dL   Calcium, Ion 3.29 (L) 1.15 - 1.40 mmol/L   TCO2 18 (L) 22 - 32 mmol/L   Hemoglobin 11.9 (L) 12.0 - 15.0 g/dL   HCT 92.4 (L) 26.8 - 34.1 %  I-Stat arterial blood gas, ED      Status: Abnormal   Collection Time: 01/11/21  9:11 PM  Result Value Ref Range   pH, Arterial 7.432 7.350 - 7.450   pCO2 arterial 27.7 (L) 32.0 - 48.0 mmHg   pO2, Arterial 226 (H) 83.0 - 108.0 mmHg   Bicarbonate 18.2 (L) 20.0 - 28.0 mmol/L   TCO2 19 (L) 22 - 32 mmol/L   O2 Saturation 100.0 %   Acid-base deficit 5.0 (H) 0.0 - 2.0 mmol/L   Sodium 131 (L) 135 - 145 mmol/L   Potassium 3.8 3.5 - 5.1 mmol/L   Calcium, Ion 1.07 (L) 1.15 - 1.40 mmol/L   HCT 28.0 (L) 36.0 - 46.0 %   Hemoglobin 9.5 (L) 12.0 - 15.0 g/dL   Patient temperature 962.2 F    Collection site Radial    Drawn by RT    Sample type ARTERIAL      Imaging Orders  DG Chest Port 1 View  DG Pelvis Portable  CT HEAD WO CONTRAST  CT CERVICAL SPINE WO CONTRAST  CT CHEST W CONTRAST  CT ABDOMEN PELVIS W CONTRAST  CT Maxillofacial Wo Contrast    Assessment and Plan   Terri Jacobson is an 80 y.o. female who presented as a level 1 trauma after a fall.  Injuries: Large intraparenchymal hematoma, subdural hematoma, uncal herniation, marked midline shift -unfortunately this does not appear to be a survivable injury.  The case was discussed with the ER doc, neurosurgery, and the radiologist.  We will discuss comfort care with the family.  Consults:  Neurosurgery contacted by ER provider    Quentin Ore, MD  [facility] Use [email or web address] to contact on call provider

## 2021-01-05 NOTE — ED Notes (Signed)
Pt extubated for comfort measures.

## 2021-01-05 NOTE — H&P (Addendum)
History and Physical  Terri Jacobson ZOX:[id number] DOB: 08/08/1940 DOA: January 14, 2021  Referring physician: Dr. Jacqulyn Bath, EDP PCP: Pcp, No  Outpatient Specialists: Psychiatry, neurology Patient coming from: Home.  Chief Complaint: Fall  HPI: Terri Jacobson is a 80 y.o. female with medical history significant for bipolar affective disorder, chronic anxiety/depression, insomnia, urge incontinence, hyperlipidemia, hypothyroidism, who presented to [facility] ED as a level 1 trauma with raccoon eyes, unequal pupils, GCS of 3, after a ground level fall around 1 pm outside, reportedly witnessed by neighbors.  Patient was intubated on arrival by EDP.  Seen by trauma surgery who reviewed imaging and discussed case with EDP, neurosurgery, and the radiologist.  Unfortunately her brain injuries are not survivable, recommended comfort care.  At the time of this visit, patient is unresponsive, extubated, cheyne-stokes respiration, on Dilaudid infusion.  Impending death.    Patient admitted under Hospitalist service at the request of EDP, Dr. Jacqulyn Bath.    Review of Systems: Review of systems as noted in the HPI. All other systems reviewed and are negative.  Past medical history: Bipolar disorder Chronic anxiety/depression Insomnia Urge incontinence Hyperlipidemia Hypothyroidism  Past surgical history: Post cholecystectomy  Social History:  has no history on file for tobacco use, alcohol use, and drug use.   No Known Allergies  Family history: Patient is adopted.    Physical Exam: BP 108/68   Pulse (!) 132   Temp 99.9 F (37.7 C)   Resp (!) 30   Ht  (1.626 m)   Wt 55 kg   SpO2 99%   BMI 20.81 kg/m   General: 80 y.o. year-old female Unresponsive, [place] eyes. Cardiovascular: Tachycardic Respiratory: Cheyne-stokes respiration Abdomen: Soft, minimal bowel sounds. Muskuloskeletal: No cyanosis, clubbing or edema. Neuro: Unresponsive. Skin: Bruising. Psychiatry: Unresponsive          Labs on  Admission:  Basic Metabolic Panel: Recent Labs  Lab 01-14-2021 2027 01-14-21 2037 01/14/2021 2111  NA 131* 131* 131*  K 4.2 4.2 3.8  CL 98 102  --   CO2 18*  --   --   GLUCOSE 294* 297*  --   BUN 17 18  --   CREATININE 1.01* 0.70  --   CALCIUM 8.1*  --   --    Liver Function Tests: Recent Labs  Lab 2021-01-14 2027  AST 55*  ALT 37  ALKPHOS 51  BILITOT 0.8  PROT 6.2*  ALBUMIN 3.5   No results for input(s): LIPASE, AMYLASE in the last 168 hours. No results for input(s): AMMONIA in the last 168 hours. CBC: Recent Labs  Lab Jan 14, 2021 2027 January 14, 2021 2037 January 14, 2021 2111  WBC 18.1*  --   --   HGB 12.0 11.9* 9.5*  HCT 36.1 35.0* 28.0*  MCV 95.8  --   --   PLT 343  --   --    Cardiac Enzymes: No results for input(s): CKTOTAL, CKMB, CKMBINDEX, TROPONINI in the last 168 hours.  BNP (last 3 results) No results for input(s): BNP in the last 8760 hours.  ProBNP (last 3 results) No results for input(s): PROBNP in the last 8760 hours.  CBG: No results for input(s): GLUCAP in the last 168 hours.  Radiological Exams on Admission: CT HEAD WO CONTRAST  Result Date: 01/14/2021 CLINICAL DATA:  Polytrauma, critical, head/C-spine injury suspected. Fall, head injury, found down, unresponsive EXAM: CT HEAD WITHOUT CONTRAST CT MAXILLOFACIAL WITHOUT CONTRAST CT CERVICAL SPINE WITHOUT CONTRAST CT CHEST, ABDOMEN AND PELVIS WITH CONTRAST TECHNIQUE: Contiguous axial images were obtained from  the base of the skull through the vertex without intravenous contrast. Multidetector CT imaging of the maxillofacial structures was performed. Multiplanar CT image reconstructions were also generated. A small metallic BB was placed on the right temple in order to reliably differentiate right from left. Multidetector CT imaging of the cervical spine was performed without intravenous contrast. Multiplanar CT image reconstructions were also generated. Multidetector CT imaging of the chest, abdomen and pelvis was  performed following the standard protocol during bolus administration of intravenous contrast. CONTRAST:  OMNIPAQUE IOHEXOL 300 MG/ML  SOLN COMPARISON:  None. FINDINGS: CT HEAD FINDINGS Brain: There is a large right intraparenchymal hematoma involving the a right temporal lobe anteriorly measuring at least 7.2 x 4.3 x 5.1 cm demonstrating significant mass effect with complete effacement of the right lateral ventricle, 14 mm right to left midline shift, and herniation of the uncus below the tentorium, best appreciated on coronal image # 37. There is significant mass effect upon the midbrain, best appreciated on axial image # 17/3. Smaller 12 mm intraparenchymal hematoma is seen involving the anterior pole of the left temporal lobe. There is extensive subarachnoid hemorrhage noted bilaterally within the sulci of the cerebral hemispheres. Bilateral subdural hematoma overlie the cerebral convexities. On the right, this measures up to 13 mm in thickness (coronal image # 24/5), layers along the right tentorium, and demonstrates additional mass effect upon the right cerebral hemisphere. On the left, this is relatively thin measuring up to 3 mm in thickness. Left lateral ventricle is of normal size. There is marked mass effect with near complete effacement of the third ventricle. Fourth ventricle is unremarkable. Complete effacement of the suprasellar cistern. Cerebellum is unremarkable. Vascular: No asymmetric hyperdense vasculature at the skull base. Skull: The calvarium is intact. Other: There is a a moderate left parietal scalp hematoma noted. Mastoid air cells and middle ear cavities are clear. CT MAXILLOFACIAL FINDINGS Osseous: No acute facial fracture.  No mandibular dislocation. Orbits: The ocular globes are intact. The ocular lenses are removed bilaterally. The retro-orbital fat is preserved. Extraocular musculature and optic nerves appear normal. There is moderate left preseptal soft tissue swelling.  Sinuses: There is layering high attenuation fluid within the left maxillary sinus and sphenoid sinuses in keeping with blood. Soft tissues: Unremarkable. CT CERVICAL SPINE FINDINGS Alignment: Normal.  No listhesis. Skull base and vertebrae: Craniocervical alignment is normal. The atlantodental interval is not widened. There is no acute fracture of the cervical spine. Vertebral body height is preserved. Soft tissues and spinal canal: No prevertebral fluid or swelling. No visible canal hematoma. Disc levels: There is intervertebral disc space narrowing and endplate remodeling at C5-C7 in keeping with changes of moderate degenerative disc disease. Remaining intervertebral disc heights are preserved. The prevertebral soft tissues are not thickened on sagittal reformats. Multilevel uncovertebral and facet arthrosis results in multilevel mild-to-moderate neuroforaminal narrowing, most severe on the left at C4-5 and bilaterally at C5-6 Other: None CT CHEST FINDINGS Cardiovascular: No significant coronary artery calcification. Global cardiac size within normal limits. No pericardial effusion. Central pulmonary arteries are of normal caliber. Thoracic aorta is unremarkable. Mediastinum/Nodes: Endotracheal tube in expected position within the distal trachea. Nasogastric tube extends into the mid body of the stomach. No pathologic adenopathy within the thorax. No mediastinal hematoma. Esophagus unremarkable. Lungs/Pleura: Mild bibasilar atelectasis. Lungs are otherwise clear. No pneumothorax or pleural effusion. Musculoskeletal: Degenerative changes noted within the shoulders. Multiple healed right rib fractures noted. No acute bone abnormality within the thorax. CT ABDOMEN AND PELVIS  FINDINGS Hepatobiliary: Status post cholecystectomy. Moderate intra and extrahepatic biliary ductal dilation may represent post cholecystectomy change, and is stable since remote prior examination of 10/15/2019. Liver unremarkable. Pancreas:  Unremarkable Spleen: Unremarkable Adrenals/Urinary Tract: Adrenal glands are unremarkable. Kidneys are normal, without renal calculi, focal lesion, or hydronephrosis. Bladder is unremarkable. Stomach/Bowel: The stomach, small bowel, and large bowel are unremarkable. No free intraperitoneal gas or fluid. Vascular/Lymphatic: Retroaortic left renal vein. Mild atherosclerotic calcification within the abdominal aorta. No aortic aneurysm. No pathologic adenopathy within the abdomen and pelvis. Reproductive: Uterus and bilateral adnexa are unremarkable. Other: No abdominal wall hernia Musculoskeletal: Degenerative changes are seen within the lumbar spine. Remote compression fracture of L2 status post vertebroplasty noted. No acute bone abnormality within the abdomen and pelvis. IMPRESSION: Probable Coup/contrecoup type injury with direct impact and moderate hematoma involving the left parietal scalp. Large intraparenchymal hematoma within the right temporal lobe measuring up to 7.2 cm in size demonstrating marked mass effect with 14 mm right to left midline shift, herniation of the uncus with effacement of the suprasellar cistern and marked mass effect upon the midbrain, complete effacement of the right lateral ventricle and near complete effacement of the third ventricle. Superimposed bilateral subdural hematoma measuring up to 13 mm in thickness on the right contributing to mass effect upon the right cerebral hemisphere. Small left subdural hematoma measures up to 3 mm in thickness without associated significant mass effect. 12 mm intraparenchymal hematoma within the anterior pole of the left temporal lobe. Extensive subarachnoid hemorrhage. Layering hemorrhage within the paranasal sinuses. No acute facial fracture identified, however. No acute fracture or listhesis of the cervical spine. No acute intrathoracic or intra-abdominal injury. These results were called by telephone at the time of interpretation on 02/03/2021 at  9:20 pm to provider JOSHUA LONG , who verbally acknowledged these results. Electronically Signed   By: Helyn NumbersAshesh  Parikh MD   On: 02/03/2021 21:39   CT CHEST W CONTRAST  Result Date: 01/11/2021 CLINICAL DATA:  Polytrauma, critical, head/C-spine injury suspected. Fall, head injury, found down, unresponsive EXAM: CT HEAD WITHOUT CONTRAST CT MAXILLOFACIAL WITHOUT CONTRAST CT CERVICAL SPINE WITHOUT CONTRAST CT CHEST, ABDOMEN AND PELVIS WITH CONTRAST TECHNIQUE: Contiguous axial images were obtained from the base of the skull through the vertex without intravenous contrast. Multidetector CT imaging of the maxillofacial structures was performed. Multiplanar CT image reconstructions were also generated. A small metallic BB was placed on the right temple in order to reliably differentiate right from left. Multidetector CT imaging of the cervical spine was performed without intravenous contrast. Multiplanar CT image reconstructions were also generated. Multidetector CT imaging of the chest, abdomen and pelvis was performed following the standard protocol during bolus administration of intravenous contrast. CONTRAST:  100mL OMNIPAQUE IOHEXOL 300 MG/ML  SOLN COMPARISON:  None. FINDINGS: CT HEAD FINDINGS Brain: There is a large right intraparenchymal hematoma involving the a right temporal lobe anteriorly measuring at least 7.2 x 4.3 x 5.1 cm demonstrating significant mass effect with complete effacement of the right lateral ventricle, 14 mm right to left midline shift, and herniation of the uncus below the tentorium, best appreciated on coronal image # 37. There is significant mass effect upon the midbrain, best appreciated on axial image # 17/3. Smaller 12 mm intraparenchymal hematoma is seen involving the anterior pole of the left temporal lobe. There is extensive subarachnoid hemorrhage noted bilaterally within the sulci of the cerebral hemispheres. Bilateral subdural hematoma overlie the cerebral convexities. On the right,  this measures up to 13  mm in thickness (coronal image # 24/5), layers along the right tentorium, and demonstrates additional mass effect upon the right cerebral hemisphere. On the left, this is relatively thin measuring up to 3 mm in thickness. Left lateral ventricle is of normal size. There is marked mass effect with near complete effacement of the third ventricle. Fourth ventricle is unremarkable. Complete effacement of the suprasellar cistern. Cerebellum is unremarkable. Vascular: No asymmetric hyperdense vasculature at the skull base. Skull: The calvarium is intact. Other: There is a a moderate left parietal scalp hematoma noted. Mastoid air cells and middle ear cavities are clear. CT MAXILLOFACIAL FINDINGS Osseous: No acute facial fracture.  No mandibular dislocation. Orbits: The ocular globes are intact. The ocular lenses are removed bilaterally. The retro-orbital fat is preserved. Extraocular musculature and optic nerves appear normal. There is moderate left preseptal soft tissue swelling. Sinuses: There is layering high attenuation fluid within the left maxillary sinus and sphenoid sinuses in keeping with blood. Soft tissues: Unremarkable. CT CERVICAL SPINE FINDINGS Alignment: Normal.  No listhesis. Skull base and vertebrae: Craniocervical alignment is normal. The atlantodental interval is not widened. There is no acute fracture of the cervical spine. Vertebral body height is preserved. Soft tissues and spinal canal: No prevertebral fluid or swelling. No visible canal hematoma. Disc levels: There is intervertebral disc space narrowing and endplate remodeling at C5-C7 in keeping with changes of moderate degenerative disc disease. Remaining intervertebral disc heights are preserved. The prevertebral soft tissues are not thickened on sagittal reformats. Multilevel uncovertebral and facet arthrosis results in multilevel mild-to-moderate neuroforaminal narrowing, most severe on the left at C4-5 and bilaterally at  C5-6 Other: None CT CHEST FINDINGS Cardiovascular: No significant coronary artery calcification. Global cardiac size within normal limits. No pericardial effusion. Central pulmonary arteries are of normal caliber. Thoracic aorta is unremarkable. Mediastinum/Nodes: Endotracheal tube in expected position within the distal trachea. Nasogastric tube extends into the mid body of the stomach. No pathologic adenopathy within the thorax. No mediastinal hematoma. Esophagus unremarkable. Lungs/Pleura: Mild bibasilar atelectasis. Lungs are otherwise clear. No pneumothorax or pleural effusion. Musculoskeletal: Degenerative changes noted within the shoulders. Multiple healed right rib fractures noted. No acute bone abnormality within the thorax. CT ABDOMEN AND PELVIS FINDINGS Hepatobiliary: Status post cholecystectomy. Moderate intra and extrahepatic biliary ductal dilation may represent post cholecystectomy change, and is stable since remote prior examination of 10/15/2019. Liver unremarkable. Pancreas: Unremarkable Spleen: Unremarkable Adrenals/Urinary Tract: Adrenal glands are unremarkable. Kidneys are normal, without renal calculi, focal lesion, or hydronephrosis. Bladder is unremarkable. Stomach/Bowel: The stomach, small bowel, and large bowel are unremarkable. No free intraperitoneal gas or fluid. Vascular/Lymphatic: Retroaortic left renal vein. Mild atherosclerotic calcification within the abdominal aorta. No aortic aneurysm. No pathologic adenopathy within the abdomen and pelvis. Reproductive: Uterus and bilateral adnexa are unremarkable. Other: No abdominal wall hernia Musculoskeletal: Degenerative changes are seen within the lumbar spine. Remote compression fracture of L2 status post vertebroplasty noted. No acute bone abnormality within the abdomen and pelvis. IMPRESSION: Probable Coup/contrecoup type injury with direct impact and moderate hematoma involving the left parietal scalp. Large intraparenchymal hematoma  within the right temporal lobe measuring up to 7.2 cm in size demonstrating marked mass effect with 14 mm right to left midline shift, herniation of the uncus with effacement of the suprasellar cistern and marked mass effect upon the midbrain, complete effacement of the right lateral ventricle and near complete effacement of the third ventricle. Superimposed bilateral subdural hematoma measuring up to 13 mm in thickness on the right contributing  to mass effect upon the right cerebral hemisphere. Small left subdural hematoma measures up to 3 mm in thickness without associated significant mass effect. 12 mm intraparenchymal hematoma within the anterior pole of the left temporal lobe. Extensive subarachnoid hemorrhage. Layering hemorrhage within the paranasal sinuses. No acute facial fracture identified, however. No acute fracture or listhesis of the cervical spine. No acute intrathoracic or intra-abdominal injury. These results were called by telephone at the time of interpretation on 01/25/2021 at 9:20 pm to provider JOSHUA LONG , who verbally acknowledged these results. Electronically Signed   By: Helyn Numbers MD   On: 01/08/2021 21:39   CT CERVICAL SPINE WO CONTRAST  Result Date: 01/18/2021 CLINICAL DATA:  Polytrauma, critical, head/C-spine injury suspected. Fall, head injury, found down, unresponsive EXAM: CT HEAD WITHOUT CONTRAST CT MAXILLOFACIAL WITHOUT CONTRAST CT CERVICAL SPINE WITHOUT CONTRAST CT CHEST, ABDOMEN AND PELVIS WITH CONTRAST TECHNIQUE: Contiguous axial images were obtained from the base of the skull through the vertex without intravenous contrast. Multidetector CT imaging of the maxillofacial structures was performed. Multiplanar CT image reconstructions were also generated. A small metallic BB was placed on the right temple in order to reliably differentiate right from left. Multidetector CT imaging of the cervical spine was performed without intravenous contrast. Multiplanar CT image  reconstructions were also generated. Multidetector CT imaging of the chest, abdomen and pelvis was performed following the standard protocol during bolus administration of intravenous contrast. CONTRAST:  OMNIPAQUE IOHEXOL 300 MG/ML  SOLN COMPARISON:  None. FINDINGS: CT HEAD FINDINGS Brain: There is a large right intraparenchymal hematoma involving the a right temporal lobe anteriorly measuring at least 7.2 x 4.3 x 5.1 cm demonstrating significant mass effect with complete effacement of the right lateral ventricle, 14 mm right to left midline shift, and herniation of the uncus below the tentorium, best appreciated on coronal image # 37. There is significant mass effect upon the midbrain, best appreciated on axial image # 17/3. Smaller 12 mm intraparenchymal hematoma is seen involving the anterior pole of the left temporal lobe. There is extensive subarachnoid hemorrhage noted bilaterally within the sulci of the cerebral hemispheres. Bilateral subdural hematoma overlie the cerebral convexities. On the right, this measures up to 13 mm in thickness (coronal image # 24/5), layers along the right tentorium, and demonstrates additional mass effect upon the right cerebral hemisphere. On the left, this is relatively thin measuring up to 3 mm in thickness. Left lateral ventricle is of normal size. There is marked mass effect with near complete effacement of the third ventricle. Fourth ventricle is unremarkable. Complete effacement of the suprasellar cistern. Cerebellum is unremarkable. Vascular: No asymmetric hyperdense vasculature at the skull base. Skull: The calvarium is intact. Other: There is a a moderate left parietal scalp hematoma noted. Mastoid air cells and middle ear cavities are clear. CT MAXILLOFACIAL FINDINGS Osseous: No acute facial fracture.  No mandibular dislocation. Orbits: The ocular globes are intact. The ocular lenses are removed bilaterally. The retro-orbital fat is preserved. Extraocular  musculature and optic nerves appear normal. There is moderate left preseptal soft tissue swelling. Sinuses: There is layering high attenuation fluid within the left maxillary sinus and sphenoid sinuses in keeping with blood. Soft tissues: Unremarkable. CT CERVICAL SPINE FINDINGS Alignment: Normal.  No listhesis. Skull base and vertebrae: Craniocervical alignment is normal. The atlantodental interval is not widened. There is no acute fracture of the cervical spine. Vertebral body height is preserved. Soft tissues and spinal canal: No prevertebral fluid or swelling. No visible canal  hematoma. Disc levels: There is intervertebral disc space narrowing and endplate remodeling at C5-C7 in keeping with changes of moderate degenerative disc disease. Remaining intervertebral disc heights are preserved. The prevertebral soft tissues are not thickened on sagittal reformats. Multilevel uncovertebral and facet arthrosis results in multilevel mild-to-moderate neuroforaminal narrowing, most severe on the left at C4-5 and bilaterally at C5-6 Other: None CT CHEST FINDINGS Cardiovascular: No significant coronary artery calcification. Global cardiac size within normal limits. No pericardial effusion. Central pulmonary arteries are of normal caliber. Thoracic aorta is unremarkable. Mediastinum/Nodes: Endotracheal tube in expected position within the distal trachea. Nasogastric tube extends into the mid body of the stomach. No pathologic adenopathy within the thorax. No mediastinal hematoma. Esophagus unremarkable. Lungs/Pleura: Mild bibasilar atelectasis. Lungs are otherwise clear. No pneumothorax or pleural effusion. Musculoskeletal: Degenerative changes noted within the shoulders. Multiple healed right rib fractures noted. No acute bone abnormality within the thorax. CT ABDOMEN AND PELVIS FINDINGS Hepatobiliary: Status post cholecystectomy. Moderate intra and extrahepatic biliary ductal dilation may represent post cholecystectomy  change, and is stable since remote prior examination of 10/15/2019. Liver unremarkable. Pancreas: Unremarkable Spleen: Unremarkable Adrenals/Urinary Tract: Adrenal glands are unremarkable. Kidneys are normal, without renal calculi, focal lesion, or hydronephrosis. Bladder is unremarkable. Stomach/Bowel: The stomach, small bowel, and large bowel are unremarkable. No free intraperitoneal gas or fluid. Vascular/Lymphatic: Retroaortic left renal vein. Mild atherosclerotic calcification within the abdominal aorta. No aortic aneurysm. No pathologic adenopathy within the abdomen and pelvis. Reproductive: Uterus and bilateral adnexa are unremarkable. Other: No abdominal wall hernia Musculoskeletal: Degenerative changes are seen within the lumbar spine. Remote compression fracture of L2 status post vertebroplasty noted. No acute bone abnormality within the abdomen and pelvis. IMPRESSION: Probable Coup/contrecoup type injury with direct impact and moderate hematoma involving the left parietal scalp. Large intraparenchymal hematoma within the right temporal lobe measuring up to 7.2 cm in size demonstrating marked mass effect with 14 mm right to left midline shift, herniation of the uncus with effacement of the suprasellar cistern and marked mass effect upon the midbrain, complete effacement of the right lateral ventricle and near complete effacement of the third ventricle. Superimposed bilateral subdural hematoma measuring up to 13 mm in thickness on the right contributing to mass effect upon the right cerebral hemisphere. Small left subdural hematoma measures up to 3 mm in thickness without associated significant mass effect. 12 mm intraparenchymal hematoma within the anterior pole of the left temporal lobe. Extensive subarachnoid hemorrhage. Layering hemorrhage within the paranasal sinuses. No acute facial fracture identified, however. No acute fracture or listhesis of the cervical spine. No acute intrathoracic or  intra-abdominal injury. These results were called by telephone at the time of interpretation on 01/16/2021 at 9:20 pm to provider JOSHUA LONG , who verbally acknowledged these results. Electronically Signed   By: Helyn Numbers MD   On: 01/16/2021 21:39   CT ABDOMEN PELVIS W CONTRAST  Result Date: 2021/01/16 CLINICAL DATA:  Polytrauma, critical, head/C-spine injury suspected. Fall, head injury, found down, unresponsive EXAM: CT HEAD WITHOUT CONTRAST CT MAXILLOFACIAL WITHOUT CONTRAST CT CERVICAL SPINE WITHOUT CONTRAST CT CHEST, ABDOMEN AND PELVIS WITH CONTRAST TECHNIQUE: Contiguous axial images were obtained from the base of the skull through the vertex without intravenous contrast. Multidetector CT imaging of the maxillofacial structures was performed. Multiplanar CT image reconstructions were also generated. A small metallic BB was placed on the right temple in order to reliably differentiate right from left. Multidetector CT imaging of the cervical spine was performed without intravenous contrast. Multiplanar CT  image reconstructions were also generated. Multidetector CT imaging of the chest, abdomen and pelvis was performed following the standard protocol during bolus administration of intravenous contrast. CONTRAST:  OMNIPAQUE IOHEXOL 300 MG/ML  SOLN COMPARISON:  None. FINDINGS: CT HEAD FINDINGS Brain: There is a large right intraparenchymal hematoma involving the a right temporal lobe anteriorly measuring at least 7.2 x 4.3 x 5.1 cm demonstrating significant mass effect with complete effacement of the right lateral ventricle, 14 mm right to left midline shift, and herniation of the uncus below the tentorium, best appreciated on coronal image # 37. There is significant mass effect upon the midbrain, best appreciated on axial image # 17/3. Smaller 12 mm intraparenchymal hematoma is seen involving the anterior pole of the left temporal lobe. There is extensive subarachnoid hemorrhage noted bilaterally  within the sulci of the cerebral hemispheres. Bilateral subdural hematoma overlie the cerebral convexities. On the right, this measures up to 13 mm in thickness (coronal image # 24/5), layers along the right tentorium, and demonstrates additional mass effect upon the right cerebral hemisphere. On the left, this is relatively thin measuring up to 3 mm in thickness. Left lateral ventricle is of normal size. There is marked mass effect with near complete effacement of the third ventricle. Fourth ventricle is unremarkable. Complete effacement of the suprasellar cistern. Cerebellum is unremarkable. Vascular: No asymmetric hyperdense vasculature at the skull base. Skull: The calvarium is intact. Other: There is a a moderate left parietal scalp hematoma noted. Mastoid air cells and middle ear cavities are clear. CT MAXILLOFACIAL FINDINGS Osseous: No acute facial fracture.  No mandibular dislocation. Orbits: The ocular globes are intact. The ocular lenses are removed bilaterally. The retro-orbital fat is preserved. Extraocular musculature and optic nerves appear normal. There is moderate left preseptal soft tissue swelling. Sinuses: There is layering high attenuation fluid within the left maxillary sinus and sphenoid sinuses in keeping with blood. Soft tissues: Unremarkable. CT CERVICAL SPINE FINDINGS Alignment: Normal.  No listhesis. Skull base and vertebrae: Craniocervical alignment is normal. The atlantodental interval is not widened. There is no acute fracture of the cervical spine. Vertebral body height is preserved. Soft tissues and spinal canal: No prevertebral fluid or swelling. No visible canal hematoma. Disc levels: There is intervertebral disc space narrowing and endplate remodeling at C5-C7 in keeping with changes of moderate degenerative disc disease. Remaining intervertebral disc heights are preserved. The prevertebral soft tissues are not thickened on sagittal reformats. Multilevel uncovertebral and facet  arthrosis results in multilevel mild-to-moderate neuroforaminal narrowing, most severe on the left at C4-5 and bilaterally at C5-6 Other: None CT CHEST FINDINGS Cardiovascular: No significant coronary artery calcification. Global cardiac size within normal limits. No pericardial effusion. Central pulmonary arteries are of normal caliber. Thoracic aorta is unremarkable. Mediastinum/Nodes: Endotracheal tube in expected position within the distal trachea. Nasogastric tube extends into the mid body of the stomach. No pathologic adenopathy within the thorax. No mediastinal hematoma. Esophagus unremarkable. Lungs/Pleura: Mild bibasilar atelectasis. Lungs are otherwise clear. No pneumothorax or pleural effusion. Musculoskeletal: Degenerative changes noted within the shoulders. Multiple healed right rib fractures noted. No acute bone abnormality within the thorax. CT ABDOMEN AND PELVIS FINDINGS Hepatobiliary: Status post cholecystectomy. Moderate intra and extrahepatic biliary ductal dilation may represent post cholecystectomy change, and is stable since remote prior examination of 10/15/2019. Liver unremarkable. Pancreas: Unremarkable Spleen: Unremarkable Adrenals/Urinary Tract: Adrenal glands are unremarkable. Kidneys are normal, without renal calculi, focal lesion, or hydronephrosis. Bladder is unremarkable. Stomach/Bowel: The stomach, small bowel, and large bowel  are unremarkable. No free intraperitoneal gas or fluid. Vascular/Lymphatic: Retroaortic left renal vein. Mild atherosclerotic calcification within the abdominal aorta. No aortic aneurysm. No pathologic adenopathy within the abdomen and pelvis. Reproductive: Uterus and bilateral adnexa are unremarkable. Other: No abdominal wall hernia Musculoskeletal: Degenerative changes are seen within the lumbar spine. Remote compression fracture of L2 status post vertebroplasty noted. No acute bone abnormality within the abdomen and pelvis. IMPRESSION: Probable  Coup/contrecoup type injury with direct impact and moderate hematoma involving the left parietal scalp. Large intraparenchymal hematoma within the right temporal lobe measuring up to 7.2 cm in size demonstrating marked mass effect with 14 mm right to left midline shift, herniation of the uncus with effacement of the suprasellar cistern and marked mass effect upon the midbrain, complete effacement of the right lateral ventricle and near complete effacement of the third ventricle. Superimposed bilateral subdural hematoma measuring up to 13 mm in thickness on the right contributing to mass effect upon the right cerebral hemisphere. Small left subdural hematoma measures up to 3 mm in thickness without associated significant mass effect. 12 mm intraparenchymal hematoma within the anterior pole of the left temporal lobe. Extensive subarachnoid hemorrhage. Layering hemorrhage within the paranasal sinuses. No acute facial fracture identified, however. No acute fracture or listhesis of the cervical spine. No acute intrathoracic or intra-abdominal injury. These results were called by telephone at the time of interpretation on 15-Jan-2021 at 9:20 pm to provider JOSHUA LONG , who verbally acknowledged these results. Electronically Signed   By: Helyn Numbers MD   On: 15-Jan-2021 21:39   DG Pelvis Portable  Result Date: 2021/01/15 CLINICAL DATA:  Ground level fall, found unresponsive EXAM: PORTABLE PELVIS 1-2 VIEWS COMPARISON:  10/15/2019 FINDINGS: Supine frontal view of the pelvis demonstrates no acute displaced fractures. The hips are well aligned. Joint spaces are well preserved. Chronic L2 compression deformity with evidence of previous vertebral augmentation. IMPRESSION: 1. No acute bony abnormality. Electronically Signed   By: Sharlet Salina M.D.   On: 2021/01/15 20:52   DG Chest Port 1 View  Result Date: 01/15/21 CLINICAL DATA:  Fall.  Unresponsive. EXAM: PORTABLE CHEST 1 VIEW COMPARISON:  10/15/2019 CT from [facility]. Plain film 09/14/2017 from [facility] FINDINGS: Endotracheal tube terminates 2.0  cm above carina. Nasogastric tube extends beyond the  inferior aspect of the film. Remote posterior right rib fractures in the setting of osteopenia. High riding humeral heads, consistent with chronic rotator cuff insufficiency. Prior left rotator cuff repair and probable distal clavicular resection. Normal heart size. Right costophrenic angle minimally excluded. No pleural effusion or pneumothorax. Clear lungs. IMPRESSION: No acute cardiopulmonary disease. Electronically Signed   By: Jeronimo Greaves M.D.   On: 2021/01/15 20:51   CT Maxillofacial Wo Contrast  Result Date: 2021-01-15 CLINICAL DATA:  Polytrauma, critical, head/C-spine injury suspected. Fall, head injury, found down, unresponsive EXAM: CT HEAD WITHOUT CONTRAST CT MAXILLOFACIAL WITHOUT CONTRAST CT CERVICAL SPINE WITHOUT CONTRAST CT CHEST, ABDOMEN AND PELVIS WITH CONTRAST TECHNIQUE: Contiguous axial images were obtained from the base of the skull through the vertex without intravenous contrast. Multidetector CT imaging of the maxillofacial structures was performed. Multiplanar CT image reconstructions were also generated. A small metallic BB was placed on the right temple in order to reliably differentiate right from left. Multidetector CT imaging of the cervical spine was performed without intravenous contrast. Multiplanar CT image reconstructions were also generated. Multidetector CT imaging of the chest, abdomen and pelvis was performed following the standard protocol during bolus  administration of intravenous contrast. CONTRAST:  OMNIPAQUE IOHEXOL 300 MG/ML  SOLN COMPARISON:  None. FINDINGS: CT HEAD FINDINGS Brain: There is a large right intraparenchymal hematoma involving the a right temporal lobe anteriorly measuring at least 7.2 x 4.3 x 5.1 cm demonstrating significant mass effect with complete effacement of the right lateral ventricle,  14 mm right to left midline shift, and herniation of the uncus below the tentorium, best appreciated on coronal image # 37. There is significant mass effect upon the midbrain, best appreciated on axial image # 17/3. Smaller 12 mm intraparenchymal hematoma is seen involving the anterior pole of the left temporal lobe. There is extensive subarachnoid hemorrhage noted bilaterally within the sulci of the cerebral hemispheres. Bilateral subdural hematoma overlie the cerebral convexities. On the right, this measures up to 13 mm in thickness (coronal image # 24/5), layers along the right tentorium, and demonstrates additional mass effect upon the right cerebral hemisphere. On the left, this is relatively thin measuring up to 3 mm in thickness. Left lateral ventricle is of normal size. There is marked mass effect with near complete effacement of the third ventricle. Fourth ventricle is unremarkable. Complete effacement of the suprasellar cistern. Cerebellum is unremarkable. Vascular: No asymmetric hyperdense vasculature at the skull base. Skull: The calvarium is intact. Other: There is a a moderate left parietal scalp hematoma noted. Mastoid air cells and middle ear cavities are clear. CT MAXILLOFACIAL FINDINGS Osseous: No acute facial fracture.  No mandibular dislocation. Orbits: The ocular globes are intact. The ocular lenses are removed bilaterally. The retro-orbital fat is preserved. Extraocular musculature and optic nerves appear normal. There is moderate left preseptal soft tissue swelling. Sinuses: There is layering high attenuation fluid within the left maxillary sinus and sphenoid sinuses in keeping with blood. Soft tissues: Unremarkable. CT CERVICAL SPINE FINDINGS Alignment: Normal.  No listhesis. Skull base and vertebrae: Craniocervical alignment is normal. The atlantodental interval is not widened. There is no acute fracture of the cervical spine. Vertebral body height is preserved. Soft tissues and spinal  canal: No prevertebral fluid or swelling. No visible canal hematoma. Disc levels: There is intervertebral disc space narrowing and endplate remodeling at C5-C7 in keeping with changes of moderate degenerative disc disease. Remaining intervertebral disc heights are preserved. The prevertebral soft tissues are not thickened on sagittal reformats. Multilevel uncovertebral and facet arthrosis results in multilevel mild-to-moderate neuroforaminal narrowing, most severe on the left at C4-5 and bilaterally at C5-6 Other: None CT CHEST FINDINGS Cardiovascular: No significant coronary artery calcification. Global cardiac size within normal limits. No pericardial effusion. Central pulmonary arteries are of normal caliber. Thoracic aorta is unremarkable. Mediastinum/Nodes: Endotracheal tube in expected position within the distal trachea. Nasogastric tube extends into the mid body of the stomach. No pathologic adenopathy within the thorax. No mediastinal hematoma. Esophagus unremarkable. Lungs/Pleura: Mild bibasilar atelectasis. Lungs are otherwise clear. No pneumothorax or pleural effusion. Musculoskeletal: Degenerative changes noted within the shoulders. Multiple healed right rib fractures noted. No acute bone abnormality within the thorax. CT ABDOMEN AND PELVIS FINDINGS Hepatobiliary: Status post cholecystectomy. Moderate intra and extrahepatic biliary ductal dilation may represent post cholecystectomy change, and is stable since remote prior examination of 10/15/2019. Liver unremarkable. Pancreas: Unremarkable Spleen: Unremarkable Adrenals/Urinary Tract: Adrenal glands are unremarkable. Kidneys are normal, without renal calculi, focal lesion, or hydronephrosis. Bladder is unremarkable. Stomach/Bowel: The stomach, small bowel, and large bowel are unremarkable. No free intraperitoneal gas or fluid. Vascular/Lymphatic: Retroaortic left renal vein. Mild atherosclerotic calcification within the abdominal aorta. No aortic  aneurysm. No pathologic adenopathy within the abdomen and pelvis. Reproductive: Uterus and bilateral adnexa are unremarkable. Other: No abdominal wall hernia Musculoskeletal: Degenerative changes are seen within the lumbar spine. Remote compression fracture of L2 status post vertebroplasty noted. No acute bone abnormality within the abdomen and pelvis. IMPRESSION: Probable Coup/contrecoup type injury with direct impact and moderate hematoma involving the left parietal scalp. Large intraparenchymal hematoma within the right temporal lobe measuring up to 7.2 cm in size demonstrating marked mass effect with 14 mm right to left midline shift, herniation of the uncus with effacement of the suprasellar cistern and marked mass effect upon the midbrain, complete effacement of the right lateral ventricle and near complete effacement of the third ventricle. Superimposed bilateral subdural hematoma measuring up to 13 mm in thickness on the right contributing to mass effect upon the right cerebral hemisphere. Small left subdural hematoma measures up to 3 mm in thickness without associated significant mass effect. 12 mm intraparenchymal hematoma within the anterior pole of the left temporal lobe. Extensive subarachnoid hemorrhage. Layering hemorrhage within the paranasal sinuses. No acute facial fracture identified, however. No acute fracture or listhesis of the cervical spine. No acute intrathoracic or intra-abdominal injury. These results were called by telephone at the time of interpretation on 01/24/2021 at 9:20 pm to provider JOSHUA LONG , who verbally acknowledged these results. Electronically Signed   By: Helyn Numbers MD   On: 01/12/2021 21:39    EKG: I independently viewed the EKG done and my findings are as followed: None available at the time of this visit.   Assessment/Plan Present on Admission: **None**  Active Problems:   Intracranial hemorrhage (HCC)  Large intraparenchymal hemorrhage Subdural  hematoma Uncal herniation Marked midline shift Seen by trauma surgery, her brain injuries are not survivable. Comfort care measures already in place in the ED. On Dilaudid drip- No family at bedside at the time of my visit. Anticipate hospital death  DVT prophylaxis: Comfort care measures  Code Status: DNR  Family Communication: None at bedside.   Disposition Plan: Med surg, likely hospital death.   Consults called: EDP discussed case with neurosurgery and trauma.   Admission status: Observation    Status is: Observation    Dispo:  Patient From: Home  Planned Disposition:Anticipated hospital death.  Medically stable for discharge: No      Darlin Drop MD [facility] Pager [phone]  If 7PM-7AM, please contact night-coverage [email or web address] Password [email or web address]  Jan 31, 2021, 12:16 AM

## 2021-01-06 DIAGNOSIS — S06360A Traumatic hemorrhage of cerebrum, unspecified, without loss of consciousness, initial encounter: Secondary | ICD-10-CM

## 2021-01-06 DIAGNOSIS — S0633AA Contusion and laceration of cerebrum, unspecified, with loss of consciousness status unknown, initial encounter: Secondary | ICD-10-CM

## 2021-01-06 DIAGNOSIS — Z515 Encounter for palliative care: Secondary | ICD-10-CM

## 2021-01-06 DIAGNOSIS — S06369A Traumatic hemorrhage of cerebrum, unspecified, with loss of consciousness of unspecified duration, initial encounter: Secondary | ICD-10-CM | POA: Diagnosis not present

## 2021-01-06 DIAGNOSIS — S06349A Traumatic hemorrhage of right cerebrum with loss of consciousness of unspecified duration, initial encounter: Secondary | ICD-10-CM

## 2021-01-06 LAB — TRIGLYCERIDES: Triglycerides: 84 mg/dL (ref <150)

## 2021-02-04 NOTE — ED Notes (Signed)
Wasted dilaudid drip with Pharmacist Malva Cogan.

## 2021-02-04 NOTE — Death Summary Note (Signed)
DEATH SUMMARY   Patient Details  Name: Terri Jacobson MRN: [id number] DOB: 1941/01/17  Admission/Discharge Information   Admit Date:  01/15/2021  Date of Death: Date of Death: 2021/01/16  Time of Death: Time of Death: 16-Oct-1125  Length of Stay: 0  Referring Physician: Pcp, No   Reason(s) for Hospitalization  Fall Large intraparenchymal hematoma/subdural hematoma/uncal herniation with midline shift Hypothyroidism Hyperlipidemia Anxiety Depression  Diagnoses  Preliminary cause of death:  Secondary Diagnoses (including complications and co-morbidities):  Active Problems:   Intracranial hemorrhage [facility])   Brief Hospital Course (including significant findings, care, treatment, and services provided and events leading to death)  80 year old with history of bipolar, chronic anxiety depression, insomnia, hypothyroidism, HLD admitted as a level 1 trauma with raccoon eyes, unequal pupil and GCS of 3.  Apparently patient had a ground-level fall witnessed by neighbor initially refused to come to the hospital thereafter agreed.Work-up showed large intraparenchymal hematoma, subdural hematoma, uncal herniation with midline shift.  Patient was transitioned to comfort care.  Pertinent Labs and Studies  Significant Diagnostic Studies CT HEAD WO CONTRAST  Result Date: 01-15-2021 CLINICAL DATA:  Polytrauma, critical, head/C-spine injury suspected. Fall, head injury, found down, unresponsive EXAM: CT HEAD WITHOUT CONTRAST CT MAXILLOFACIAL WITHOUT CONTRAST CT CERVICAL SPINE WITHOUT CONTRAST CT CHEST, ABDOMEN AND PELVIS WITH CONTRAST TECHNIQUE: Contiguous axial images were obtained from the base of the skull through the vertex without intravenous contrast. Multidetector CT imaging of the maxillofacial structures was performed. Multiplanar CT image reconstructions were also generated. A small metallic BB was placed on the right temple in order to reliably differentiate right from left. Multidetector CT imaging of  the cervical spine was performed without intravenous contrast. Multiplanar CT image reconstructions were also generated. Multidetector CT imaging of the chest, abdomen and pelvis was performed following the standard protocol during bolus administration of intravenous contrast. CONTRAST:  OMNIPAQUE IOHEXOL 300 MG/ML  SOLN COMPARISON:  None. FINDINGS: CT HEAD FINDINGS Brain: There is a large right intraparenchymal hematoma involving the a right temporal lobe anteriorly measuring at least 7.2 x 4.3 x 5.1 cm demonstrating significant mass effect with complete effacement of the right lateral ventricle, 14 mm right to left midline shift, and herniation of the uncus below the tentorium, best appreciated on coronal image # 37. There is significant mass effect upon the midbrain, best appreciated on axial image # 17/3. Smaller 12 mm intraparenchymal hematoma is seen involving the anterior pole of the left temporal lobe. There is extensive subarachnoid hemorrhage noted bilaterally within the sulci of the cerebral hemispheres. Bilateral subdural hematoma overlie the cerebral convexities. On the right, this measures up to 13 mm in thickness (coronal image # 24/5), layers along the right tentorium, and demonstrates additional mass effect upon the right cerebral hemisphere. On the left, this is relatively thin measuring up to 3 mm in thickness. Left lateral ventricle is of normal size. There is marked mass effect with near complete effacement of the third ventricle. Fourth ventricle is unremarkable. Complete effacement of the suprasellar cistern. Cerebellum is unremarkable. Vascular: No asymmetric hyperdense vasculature at the skull base. Skull: The calvarium is intact. Other: There is a a moderate left parietal scalp hematoma noted. Mastoid air cells and middle ear cavities are clear. CT MAXILLOFACIAL FINDINGS Osseous: No acute facial fracture.  No mandibular dislocation. Orbits: The ocular globes are intact. The ocular  lenses are removed bilaterally. The retro-orbital fat is preserved. Extraocular musculature and optic nerves appear normal. There is moderate left preseptal soft tissue swelling. Sinuses:  There is layering high attenuation fluid within the left maxillary sinus and sphenoid sinuses in keeping with blood. Soft tissues: Unremarkable. CT CERVICAL SPINE FINDINGS Alignment: Normal.  No listhesis. Skull base and vertebrae: Craniocervical alignment is normal. The atlantodental interval is not widened. There is no acute fracture of the cervical spine. Vertebral body height is preserved. Soft tissues and spinal canal: No prevertebral fluid or swelling. No visible canal hematoma. Disc levels: There is intervertebral disc space narrowing and endplate remodeling at C5-C7 in keeping with changes of moderate degenerative disc disease. Remaining intervertebral disc heights are preserved. The prevertebral soft tissues are not thickened on sagittal reformats. Multilevel uncovertebral and facet arthrosis results in multilevel mild-to-moderate neuroforaminal narrowing, most severe on the left at C4-5 and bilaterally at C5-6 Other: None CT CHEST FINDINGS Cardiovascular: No significant coronary artery calcification. Global cardiac size within normal limits. No pericardial effusion. Central pulmonary arteries are of normal caliber. Thoracic aorta is unremarkable. Mediastinum/Nodes: Endotracheal tube in expected position within the distal trachea. Nasogastric tube extends into the mid body of the stomach. No pathologic adenopathy within the thorax. No mediastinal hematoma. Esophagus unremarkable. Lungs/Pleura: Mild bibasilar atelectasis. Lungs are otherwise clear. No pneumothorax or pleural effusion. Musculoskeletal: Degenerative changes noted within the shoulders. Multiple healed right rib fractures noted. No acute bone abnormality within the thorax. CT ABDOMEN AND PELVIS FINDINGS Hepatobiliary: Status post cholecystectomy. Moderate intra  and extrahepatic biliary ductal dilation may represent post cholecystectomy change, and is stable since remote prior examination of 10/15/2019. Liver unremarkable. Pancreas: Unremarkable Spleen: Unremarkable Adrenals/Urinary Tract: Adrenal glands are unremarkable. Kidneys are normal, without renal calculi, focal lesion, or hydronephrosis. Bladder is unremarkable. Stomach/Bowel: The stomach, small bowel, and large bowel are unremarkable. No free intraperitoneal gas or fluid. Vascular/Lymphatic: Retroaortic left renal vein. Mild atherosclerotic calcification within the abdominal aorta. No aortic aneurysm. No pathologic adenopathy within the abdomen and pelvis. Reproductive: Uterus and bilateral adnexa are unremarkable. Other: No abdominal wall hernia Musculoskeletal: Degenerative changes are seen within the lumbar spine. Remote compression fracture of L2 status post vertebroplasty noted. No acute bone abnormality within the abdomen and pelvis. IMPRESSION: Probable Coup/contrecoup type injury with direct impact and moderate hematoma involving the left parietal scalp. Large intraparenchymal hematoma within the right temporal lobe measuring up to 7.2 cm in size demonstrating marked mass effect with 14 mm right to left midline shift, herniation of the uncus with effacement of the suprasellar cistern and marked mass effect upon the midbrain, complete effacement of the right lateral ventricle and near complete effacement of the third ventricle. Superimposed bilateral subdural hematoma measuring up to 13 mm in thickness on the right contributing to mass effect upon the right cerebral hemisphere. Small left subdural hematoma measures up to 3 mm in thickness without associated significant mass effect. 12 mm intraparenchymal hematoma within the anterior pole of the left temporal lobe. Extensive subarachnoid hemorrhage. Layering hemorrhage within the paranasal sinuses. No acute facial fracture identified, however. No acute  fracture or listhesis of the cervical spine. No acute intrathoracic or intra-abdominal injury. These results were called by telephone at the time of interpretation on 01/22/2021 at 9:20 pm to provider JOSHUA LONG , who verbally acknowledged these results. Electronically Signed   By: Helyn Numbers MD   On: 01/11/2021 21:39   CT CHEST W CONTRAST  Result Date: 01/08/2021 CLINICAL DATA:  Polytrauma, critical, head/C-spine injury suspected. Fall, head injury, found down, unresponsive EXAM: CT HEAD WITHOUT CONTRAST CT MAXILLOFACIAL WITHOUT CONTRAST CT CERVICAL SPINE WITHOUT CONTRAST CT CHEST, ABDOMEN  AND PELVIS WITH CONTRAST TECHNIQUE: Contiguous axial images were obtained from the base of the skull through the vertex without intravenous contrast. Multidetector CT imaging of the maxillofacial structures was performed. Multiplanar CT image reconstructions were also generated. A small metallic BB was placed on the right temple in order to reliably differentiate right from left. Multidetector CT imaging of the cervical spine was performed without intravenous contrast. Multiplanar CT image reconstructions were also generated. Multidetector CT imaging of the chest, abdomen and pelvis was performed following the standard protocol during bolus administration of intravenous contrast. CONTRAST:  OMNIPAQUE IOHEXOL 300 MG/ML  SOLN COMPARISON:  None. FINDINGS: CT HEAD FINDINGS Brain: There is a large right intraparenchymal hematoma involving the a right temporal lobe anteriorly measuring at least 7.2 x 4.3 x 5.1 cm demonstrating significant mass effect with complete effacement of the right lateral ventricle, 14 mm right to left midline shift, and herniation of the uncus below the tentorium, best appreciated on coronal image # 37. There is significant mass effect upon the midbrain, best appreciated on axial image # 17/3. Smaller 12 mm intraparenchymal hematoma is seen involving the anterior pole of the left temporal lobe.  There is extensive subarachnoid hemorrhage noted bilaterally within the sulci of the cerebral hemispheres. Bilateral subdural hematoma overlie the cerebral convexities. On the right, this measures up to 13 mm in thickness (coronal image # 24/5), layers along the right tentorium, and demonstrates additional mass effect upon the right cerebral hemisphere. On the left, this is relatively thin measuring up to 3 mm in thickness. Left lateral ventricle is of normal size. There is marked mass effect with near complete effacement of the third ventricle. Fourth ventricle is unremarkable. Complete effacement of the suprasellar cistern. Cerebellum is unremarkable. Vascular: No asymmetric hyperdense vasculature at the skull base. Skull: The calvarium is intact. Other: There is a a moderate left parietal scalp hematoma noted. Mastoid air cells and middle ear cavities are clear. CT MAXILLOFACIAL FINDINGS Osseous: No acute facial fracture.  No mandibular dislocation. Orbits: The ocular globes are intact. The ocular lenses are removed bilaterally. The retro-orbital fat is preserved. Extraocular musculature and optic nerves appear normal. There is moderate left preseptal soft tissue swelling. Sinuses: There is layering high attenuation fluid within the left maxillary sinus and sphenoid sinuses in keeping with blood. Soft tissues: Unremarkable. CT CERVICAL SPINE FINDINGS Alignment: Normal.  No listhesis. Skull base and vertebrae: Craniocervical alignment is normal. The atlantodental interval is not widened. There is no acute fracture of the cervical spine. Vertebral body height is preserved. Soft tissues and spinal canal: No prevertebral fluid or swelling. No visible canal hematoma. Disc levels: There is intervertebral disc space narrowing and endplate remodeling at C5-C7 in keeping with changes of moderate degenerative disc disease. Remaining intervertebral disc heights are preserved. The prevertebral soft tissues are not thickened  on sagittal reformats. Multilevel uncovertebral and facet arthrosis results in multilevel mild-to-moderate neuroforaminal narrowing, most severe on the left at C4-5 and bilaterally at C5-6 Other: None CT CHEST FINDINGS Cardiovascular: No significant coronary artery calcification. Global cardiac size within normal limits. No pericardial effusion. Central pulmonary arteries are of normal caliber. Thoracic aorta is unremarkable. Mediastinum/Nodes: Endotracheal tube in expected position within the distal trachea. Nasogastric tube extends into the mid body of the stomach. No pathologic adenopathy within the thorax. No mediastinal hematoma. Esophagus unremarkable. Lungs/Pleura: Mild bibasilar atelectasis. Lungs are otherwise clear. No pneumothorax or pleural effusion. Musculoskeletal: Degenerative changes noted within the shoulders. Multiple healed right rib fractures noted.  No acute bone abnormality within the thorax. CT ABDOMEN AND PELVIS FINDINGS Hepatobiliary: Status post cholecystectomy. Moderate intra and extrahepatic biliary ductal dilation may represent post cholecystectomy change, and is stable since remote prior examination of 10/15/2019. Liver unremarkable. Pancreas: Unremarkable Spleen: Unremarkable Adrenals/Urinary Tract: Adrenal glands are unremarkable. Kidneys are normal, without renal calculi, focal lesion, or hydronephrosis. Bladder is unremarkable. Stomach/Bowel: The stomach, small bowel, and large bowel are unremarkable. No free intraperitoneal gas or fluid. Vascular/Lymphatic: Retroaortic left renal vein. Mild atherosclerotic calcification within the abdominal aorta. No aortic aneurysm. No pathologic adenopathy within the abdomen and pelvis. Reproductive: Uterus and bilateral adnexa are unremarkable. Other: No abdominal wall hernia Musculoskeletal: Degenerative changes are seen within the lumbar spine. Remote compression fracture of L2 status post vertebroplasty noted. No acute bone abnormality within  the abdomen and pelvis. IMPRESSION: Probable Coup/contrecoup type injury with direct impact and moderate hematoma involving the left parietal scalp. Large intraparenchymal hematoma within the right temporal lobe measuring up to 7.2 cm in size demonstrating marked mass effect with 14 mm right to left midline shift, herniation of the uncus with effacement of the suprasellar cistern and marked mass effect upon the midbrain, complete effacement of the right lateral ventricle and near complete effacement of the third ventricle. Superimposed bilateral subdural hematoma measuring up to 13 mm in thickness on the right contributing to mass effect upon the right cerebral hemisphere. Small left subdural hematoma measures up to 3 mm in thickness without associated significant mass effect. 12 mm intraparenchymal hematoma within the anterior pole of the left temporal lobe. Extensive subarachnoid hemorrhage. Layering hemorrhage within the paranasal sinuses. No acute facial fracture identified, however. No acute fracture or listhesis of the cervical spine. No acute intrathoracic or intra-abdominal injury. These results were called by telephone at the time of interpretation on 01/17/2021 at 9:20 pm to provider JOSHUA LONG , who verbally acknowledged these results. Electronically Signed   By: Helyn Numbers MD   On: 01/28/2021 21:39   CT CERVICAL SPINE WO CONTRAST  Result Date: 01/21/2021 CLINICAL DATA:  Polytrauma, critical, head/C-spine injury suspected. Fall, head injury, found down, unresponsive EXAM: CT HEAD WITHOUT CONTRAST CT MAXILLOFACIAL WITHOUT CONTRAST CT CERVICAL SPINE WITHOUT CONTRAST CT CHEST, ABDOMEN AND PELVIS WITH CONTRAST TECHNIQUE: Contiguous axial images were obtained from the base of the skull through the vertex without intravenous contrast. Multidetector CT imaging of the maxillofacial structures was performed. Multiplanar CT image reconstructions were also generated. A small metallic BB was placed on the right  temple in order to reliably differentiate right from left. Multidetector CT imaging of the cervical spine was performed without intravenous contrast. Multiplanar CT image reconstructions were also generated. Multidetector CT imaging of the chest, abdomen and pelvis was performed following the standard protocol during bolus administration of intravenous contrast. CONTRAST:  OMNIPAQUE IOHEXOL 300 MG/ML  SOLN COMPARISON:  None. FINDINGS: CT HEAD FINDINGS Brain: There is a large right intraparenchymal hematoma involving the a right temporal lobe anteriorly measuring at least 7.2 x 4.3 x 5.1 cm demonstrating significant mass effect with complete effacement of the right lateral ventricle, 14 mm right to left midline shift, and herniation of the uncus below the tentorium, best appreciated on coronal image # 37. There is significant mass effect upon the midbrain, best appreciated on axial image # 17/3. Smaller 12 mm intraparenchymal hematoma is seen involving the anterior pole of the left temporal lobe. There is extensive subarachnoid hemorrhage noted bilaterally within the sulci of the cerebral hemispheres. Bilateral subdural hematoma overlie  the cerebral convexities. On the right, this measures up to 13 mm in thickness (coronal image # 24/5), layers along the right tentorium, and demonstrates additional mass effect upon the right cerebral hemisphere. On the left, this is relatively thin measuring up to 3 mm in thickness. Left lateral ventricle is of normal size. There is marked mass effect with near complete effacement of the third ventricle. Fourth ventricle is unremarkable. Complete effacement of the suprasellar cistern. Cerebellum is unremarkable. Vascular: No asymmetric hyperdense vasculature at the skull base. Skull: The calvarium is intact. Other: There is a a moderate left parietal scalp hematoma noted. Mastoid air cells and middle ear cavities are clear. CT MAXILLOFACIAL FINDINGS Osseous: No acute facial  fracture.  No mandibular dislocation. Orbits: The ocular globes are intact. The ocular lenses are removed bilaterally. The retro-orbital fat is preserved. Extraocular musculature and optic nerves appear normal. There is moderate left preseptal soft tissue swelling. Sinuses: There is layering high attenuation fluid within the left maxillary sinus and sphenoid sinuses in keeping with blood. Soft tissues: Unremarkable. CT CERVICAL SPINE FINDINGS Alignment: Normal.  No listhesis. Skull base and vertebrae: Craniocervical alignment is normal. The atlantodental interval is not widened. There is no acute fracture of the cervical spine. Vertebral body height is preserved. Soft tissues and spinal canal: No prevertebral fluid or swelling. No visible canal hematoma. Disc levels: There is intervertebral disc space narrowing and endplate remodeling at C5-C7 in keeping with changes of moderate degenerative disc disease. Remaining intervertebral disc heights are preserved. The prevertebral soft tissues are not thickened on sagittal reformats. Multilevel uncovertebral and facet arthrosis results in multilevel mild-to-moderate neuroforaminal narrowing, most severe on the left at C4-5 and bilaterally at C5-6 Other: None CT CHEST FINDINGS Cardiovascular: No significant coronary artery calcification. Global cardiac size within normal limits. No pericardial effusion. Central pulmonary arteries are of normal caliber. Thoracic aorta is unremarkable. Mediastinum/Nodes: Endotracheal tube in expected position within the distal trachea. Nasogastric tube extends into the mid body of the stomach. No pathologic adenopathy within the thorax. No mediastinal hematoma. Esophagus unremarkable. Lungs/Pleura: Mild bibasilar atelectasis. Lungs are otherwise clear. No pneumothorax or pleural effusion. Musculoskeletal: Degenerative changes noted within the shoulders. Multiple healed right rib fractures noted. No acute bone abnormality within the thorax. CT  ABDOMEN AND PELVIS FINDINGS Hepatobiliary: Status post cholecystectomy. Moderate intra and extrahepatic biliary ductal dilation may represent post cholecystectomy change, and is stable since remote prior examination of 10/15/2019. Liver unremarkable. Pancreas: Unremarkable Spleen: Unremarkable Adrenals/Urinary Tract: Adrenal glands are unremarkable. Kidneys are normal, without renal calculi, focal lesion, or hydronephrosis. Bladder is unremarkable. Stomach/Bowel: The stomach, small bowel, and large bowel are unremarkable. No free intraperitoneal gas or fluid. Vascular/Lymphatic: Retroaortic left renal vein. Mild atherosclerotic calcification within the abdominal aorta. No aortic aneurysm. No pathologic adenopathy within the abdomen and pelvis. Reproductive: Uterus and bilateral adnexa are unremarkable. Other: No abdominal wall hernia Musculoskeletal: Degenerative changes are seen within the lumbar spine. Remote compression fracture of L2 status post vertebroplasty noted. No acute bone abnormality within the abdomen and pelvis. IMPRESSION: Probable Coup/contrecoup type injury with direct impact and moderate hematoma involving the left parietal scalp. Large intraparenchymal hematoma within the right temporal lobe measuring up to 7.2 cm in size demonstrating marked mass effect with 14 mm right to left midline shift, herniation of the uncus with effacement of the suprasellar cistern and marked mass effect upon the midbrain, complete effacement of the right lateral ventricle and near complete effacement of the third ventricle. Superimposed bilateral subdural hematoma  measuring up to 13 mm in thickness on the right contributing to mass effect upon the right cerebral hemisphere. Small left subdural hematoma measures up to 3 mm in thickness without associated significant mass effect. 12 mm intraparenchymal hematoma within the anterior pole of the left temporal lobe. Extensive subarachnoid hemorrhage. Layering hemorrhage  within the paranasal sinuses. No acute facial fracture identified, however. No acute fracture or listhesis of the cervical spine. No acute intrathoracic or intra-abdominal injury. These results were called by telephone at the time of interpretation on 01/16/2021 at 9:20 pm to provider JOSHUA LONG , who verbally acknowledged these results. Electronically Signed   By: Helyn Numbers MD   On: 01/22/2021 21:39   CT ABDOMEN PELVIS W CONTRAST  Result Date: 01/18/2021 CLINICAL DATA:  Polytrauma, critical, head/C-spine injury suspected. Fall, head injury, found down, unresponsive EXAM: CT HEAD WITHOUT CONTRAST CT MAXILLOFACIAL WITHOUT CONTRAST CT CERVICAL SPINE WITHOUT CONTRAST CT CHEST, ABDOMEN AND PELVIS WITH CONTRAST TECHNIQUE: Contiguous axial images were obtained from the base of the skull through the vertex without intravenous contrast. Multidetector CT imaging of the maxillofacial structures was performed. Multiplanar CT image reconstructions were also generated. A small metallic BB was placed on the right temple in order to reliably differentiate right from left. Multidetector CT imaging of the cervical spine was performed without intravenous contrast. Multiplanar CT image reconstructions were also generated. Multidetector CT imaging of the chest, abdomen and pelvis was performed following the standard protocol during bolus administration of intravenous contrast. CONTRAST:  OMNIPAQUE IOHEXOL 300 MG/ML  SOLN COMPARISON:  None. FINDINGS: CT HEAD FINDINGS Brain: There is a large right intraparenchymal hematoma involving the a right temporal lobe anteriorly measuring at least 7.2 x 4.3 x 5.1 cm demonstrating significant mass effect with complete effacement of the right lateral ventricle, 14 mm right to left midline shift, and herniation of the uncus below the tentorium, best appreciated on coronal image # 37. There is significant mass effect upon the midbrain, best appreciated on axial image # 17/3. Smaller 12 mm  intraparenchymal hematoma is seen involving the anterior pole of the left temporal lobe. There is extensive subarachnoid hemorrhage noted bilaterally within the sulci of the cerebral hemispheres. Bilateral subdural hematoma overlie the cerebral convexities. On the right, this measures up to 13 mm in thickness (coronal image # 24/5), layers along the right tentorium, and demonstrates additional mass effect upon the right cerebral hemisphere. On the left, this is relatively thin measuring up to 3 mm in thickness. Left lateral ventricle is of normal size. There is marked mass effect with near complete effacement of the third ventricle. Fourth ventricle is unremarkable. Complete effacement of the suprasellar cistern. Cerebellum is unremarkable. Vascular: No asymmetric hyperdense vasculature at the skull base. Skull: The calvarium is intact. Other: There is a a moderate left parietal scalp hematoma noted. Mastoid air cells and middle ear cavities are clear. CT MAXILLOFACIAL FINDINGS Osseous: No acute facial fracture.  No mandibular dislocation. Orbits: The ocular globes are intact. The ocular lenses are removed bilaterally. The retro-orbital fat is preserved. Extraocular musculature and optic nerves appear normal. There is moderate left preseptal soft tissue swelling. Sinuses: There is layering high attenuation fluid within the left maxillary sinus and sphenoid sinuses in keeping with blood. Soft tissues: Unremarkable. CT CERVICAL SPINE FINDINGS Alignment: Normal.  No listhesis. Skull base and vertebrae: Craniocervical alignment is normal. The atlantodental interval is not widened. There is no acute fracture of the cervical spine. Vertebral body height is preserved. Soft tissues  and spinal canal: No prevertebral fluid or swelling. No visible canal hematoma. Disc levels: There is intervertebral disc space narrowing and endplate remodeling at C5-C7 in keeping with changes of moderate degenerative disc disease. Remaining  intervertebral disc heights are preserved. The prevertebral soft tissues are not thickened on sagittal reformats. Multilevel uncovertebral and facet arthrosis results in multilevel mild-to-moderate neuroforaminal narrowing, most severe on the left at C4-5 and bilaterally at C5-6 Other: None CT CHEST FINDINGS Cardiovascular: No significant coronary artery calcification. Global cardiac size within normal limits. No pericardial effusion. Central pulmonary arteries are of normal caliber. Thoracic aorta is unremarkable. Mediastinum/Nodes: Endotracheal tube in expected position within the distal trachea. Nasogastric tube extends into the mid body of the stomach. No pathologic adenopathy within the thorax. No mediastinal hematoma. Esophagus unremarkable. Lungs/Pleura: Mild bibasilar atelectasis. Lungs are otherwise clear. No pneumothorax or pleural effusion. Musculoskeletal: Degenerative changes noted within the shoulders. Multiple healed right rib fractures noted. No acute bone abnormality within the thorax. CT ABDOMEN AND PELVIS FINDINGS Hepatobiliary: Status post cholecystectomy. Moderate intra and extrahepatic biliary ductal dilation may represent post cholecystectomy change, and is stable since remote prior examination of 10/15/2019. Liver unremarkable. Pancreas: Unremarkable Spleen: Unremarkable Adrenals/Urinary Tract: Adrenal glands are unremarkable. Kidneys are normal, without renal calculi, focal lesion, or hydronephrosis. Bladder is unremarkable. Stomach/Bowel: The stomach, small bowel, and large bowel are unremarkable. No free intraperitoneal gas or fluid. Vascular/Lymphatic: Retroaortic left renal vein. Mild atherosclerotic calcification within the abdominal aorta. No aortic aneurysm. No pathologic adenopathy within the abdomen and pelvis. Reproductive: Uterus and bilateral adnexa are unremarkable. Other: No abdominal wall hernia Musculoskeletal: Degenerative changes are seen within the lumbar spine. Remote  compression fracture of L2 status post vertebroplasty noted. No acute bone abnormality within the abdomen and pelvis. IMPRESSION: Probable Coup/contrecoup type injury with direct impact and moderate hematoma involving the left parietal scalp. Large intraparenchymal hematoma within the right temporal lobe measuring up to 7.2 cm in size demonstrating marked mass effect with 14 mm right to left midline shift, herniation of the uncus with effacement of the suprasellar cistern and marked mass effect upon the midbrain, complete effacement of the right lateral ventricle and near complete effacement of the third ventricle. Superimposed bilateral subdural hematoma measuring up to 13 mm in thickness on the right contributing to mass effect upon the right cerebral hemisphere. Small left subdural hematoma measures up to 3 mm in thickness without associated significant mass effect. 12 mm intraparenchymal hematoma within the anterior pole of the left temporal lobe. Extensive subarachnoid hemorrhage. Layering hemorrhage within the paranasal sinuses. No acute facial fracture identified, however. No acute fracture or listhesis of the cervical spine. No acute intrathoracic or intra-abdominal injury. These results were called by telephone at the time of interpretation on 01/29/2021 at 9:20 pm to provider JOSHUA LONG , who verbally acknowledged these results. Electronically Signed   By: Helyn NumbersAshesh  Parikh MD   On: 02-17-21 21:39   DG Pelvis Portable  Result Date: 02/01/2021 CLINICAL DATA:  Ground level fall, found unresponsive EXAM: PORTABLE PELVIS 1-2 VIEWS COMPARISON:  10/15/2019 FINDINGS: Supine frontal view of the pelvis demonstrates no acute displaced fractures. The hips are well aligned. Joint spaces are well preserved. Chronic L2 compression deformity with evidence of previous vertebral augmentation. IMPRESSION: 1. No acute bony abnormality. Electronically Signed   By: Sharlet SalinaMichael  Brown M.D.   On: 02-17-21 20:52   DG Chest Port  1 View  Result Date: 01/20/2021 CLINICAL DATA:  Fall.  Unresponsive. EXAM: PORTABLE CHEST 1 VIEW COMPARISON:  10/15/2019 CT from [facility]. Plain film 09/14/2017 from [facility] FINDINGS: Endotracheal tube terminates 2.0  cm above carina. Nasogastric tube extends beyond the  inferior aspect of the film. Remote posterior right rib fractures in the setting of osteopenia. High riding humeral heads, consistent with chronic rotator cuff insufficiency. Prior left rotator cuff repair and probable distal clavicular resection. Normal heart size. Right costophrenic angle minimally excluded. No pleural effusion or pneumothorax. Clear lungs. IMPRESSION: No acute cardiopulmonary disease. Electronically Signed   By: Jeronimo Greaves M.D.   On: 01/08/2021 20:51   CT Maxillofacial Wo Contrast  Result Date: 01/07/2021 CLINICAL DATA:  Polytrauma, critical, head/C-spine injury suspected. Fall, head injury, found down, unresponsive EXAM: CT HEAD WITHOUT CONTRAST CT MAXILLOFACIAL WITHOUT CONTRAST CT CERVICAL SPINE WITHOUT CONTRAST CT CHEST, ABDOMEN AND PELVIS WITH CONTRAST TECHNIQUE: Contiguous axial images were obtained from the base of the skull through the vertex without intravenous contrast. Multidetector CT imaging of the maxillofacial structures was performed. Multiplanar CT image reconstructions were also generated. A small metallic BB was placed on the right temple in order to reliably differentiate right from left. Multidetector CT imaging of the cervical spine was performed without intravenous contrast. Multiplanar CT image reconstructions were also generated. Multidetector CT imaging of the chest, abdomen and pelvis was performed following the standard protocol during bolus administration of intravenous contrast. CONTRAST:  OMNIPAQUE IOHEXOL 300 MG/ML  SOLN COMPARISON:  None. FINDINGS: CT HEAD FINDINGS Brain: There is a large right intraparenchymal hematoma involving the a right temporal lobe  anteriorly measuring at least 7.2 x 4.3 x 5.1 cm demonstrating significant mass effect with complete effacement of the right lateral ventricle, 14 mm right to left midline shift, and herniation of the uncus below the tentorium, best appreciated on coronal image # 37. There is significant mass effect upon the midbrain, best appreciated on axial image # 17/3. Smaller 12 mm intraparenchymal hematoma is seen involving the anterior pole of the left temporal lobe. There is extensive subarachnoid hemorrhage noted bilaterally within the sulci of the cerebral hemispheres. Bilateral subdural hematoma overlie the cerebral convexities. On the right, this measures up to 13 mm in thickness (coronal image # 24/5), layers along the right tentorium, and demonstrates additional mass effect upon the right cerebral hemisphere. On the left, this is relatively thin measuring up to 3 mm in thickness. Left lateral ventricle is of normal size. There is marked mass effect with near complete effacement of the third ventricle. Fourth ventricle is unremarkable. Complete effacement of the suprasellar cistern. Cerebellum is unremarkable. Vascular: No asymmetric hyperdense vasculature at the skull base. Skull: The calvarium is intact. Other: There is a a moderate left parietal scalp hematoma noted. Mastoid air cells and middle ear cavities are clear. CT MAXILLOFACIAL FINDINGS Osseous: No acute facial fracture.  No mandibular dislocation. Orbits: The ocular globes are intact. The ocular lenses are removed bilaterally. The retro-orbital fat is preserved. Extraocular musculature and optic nerves appear normal. There is moderate left preseptal soft tissue swelling. Sinuses: There is layering high attenuation fluid within the left maxillary sinus and sphenoid sinuses in keeping with blood. Soft tissues: Unremarkable. CT CERVICAL SPINE FINDINGS Alignment: Normal.  No listhesis. Skull base and vertebrae: Craniocervical alignment is normal. The  atlantodental interval is not widened. There is no acute fracture of the cervical spine. Vertebral body height is preserved. Soft tissues and spinal canal: No prevertebral fluid or swelling. No visible canal hematoma. Disc levels: There is intervertebral disc space narrowing and endplate  remodeling at C5-C7 in keeping with changes of moderate degenerative disc disease. Remaining intervertebral disc heights are preserved. The prevertebral soft tissues are not thickened on sagittal reformats. Multilevel uncovertebral and facet arthrosis results in multilevel mild-to-moderate neuroforaminal narrowing, most severe on the left at C4-5 and bilaterally at C5-6 Other: None CT CHEST FINDINGS Cardiovascular: No significant coronary artery calcification. Global cardiac size within normal limits. No pericardial effusion. Central pulmonary arteries are of normal caliber. Thoracic aorta is unremarkable. Mediastinum/Nodes: Endotracheal tube in expected position within the distal trachea. Nasogastric tube extends into the mid body of the stomach. No pathologic adenopathy within the thorax. No mediastinal hematoma. Esophagus unremarkable. Lungs/Pleura: Mild bibasilar atelectasis. Lungs are otherwise clear. No pneumothorax or pleural effusion. Musculoskeletal: Degenerative changes noted within the shoulders. Multiple healed right rib fractures noted. No acute bone abnormality within the thorax. CT ABDOMEN AND PELVIS FINDINGS Hepatobiliary: Status post cholecystectomy. Moderate intra and extrahepatic biliary ductal dilation may represent post cholecystectomy change, and is stable since remote prior examination of 10/15/2019. Liver unremarkable. Pancreas: Unremarkable Spleen: Unremarkable Adrenals/Urinary Tract: Adrenal glands are unremarkable. Kidneys are normal, without renal calculi, focal lesion, or hydronephrosis. Bladder is unremarkable. Stomach/Bowel: The stomach, small bowel, and large bowel are unremarkable. No free  intraperitoneal gas or fluid. Vascular/Lymphatic: Retroaortic left renal vein. Mild atherosclerotic calcification within the abdominal aorta. No aortic aneurysm. No pathologic adenopathy within the abdomen and pelvis. Reproductive: Uterus and bilateral adnexa are unremarkable. Other: No abdominal wall hernia Musculoskeletal: Degenerative changes are seen within the lumbar spine. Remote compression fracture of L2 status post vertebroplasty noted. No acute bone abnormality within the abdomen and pelvis. IMPRESSION: Probable Coup/contrecoup type injury with direct impact and moderate hematoma involving the left parietal scalp. Large intraparenchymal hematoma within the right temporal lobe measuring up to 7.2 cm in size demonstrating marked mass effect with 14 mm right to left midline shift, herniation of the uncus with effacement of the suprasellar cistern and marked mass effect upon the midbrain, complete effacement of the right lateral ventricle and near complete effacement of the third ventricle. Superimposed bilateral subdural hematoma measuring up to 13 mm in thickness on the right contributing to mass effect upon the right cerebral hemisphere. Small left subdural hematoma measures up to 3 mm in thickness without associated significant mass effect. 12 mm intraparenchymal hematoma within the anterior pole of the left temporal lobe. Extensive subarachnoid hemorrhage. Layering hemorrhage within the paranasal sinuses. No acute facial fracture identified, however. No acute fracture or listhesis of the cervical spine. No acute intrathoracic or intra-abdominal injury. These results were called by telephone at the time of interpretation on 28-Jan-2021 at 9:20 pm to provider JOSHUA LONG , who verbally acknowledged these results. Electronically Signed   By: Helyn Numbers MD   On: 01/28/21 21:39    Microbiology Recent Results (from the past 240 hour(s))  Resp Panel by RT-PCR (Flu A&B, Covid) Nasopharyngeal Swab      Status: None   Collection Time: 28-Jan-2021  8:27 PM   Specimen: Nasopharyngeal Swab; Nasopharyngeal(NP) swabs in vial transport medium  Result Value Ref Range Status   SARS Coronavirus 2 by RT PCR NEGATIVE NEGATIVE Final    Comment: (NOTE) SARS-CoV-2 target nucleic acids are NOT DETECTED.  The SARS-CoV-2 RNA is generally detectable in upper respiratory specimens during the acute phase of infection. The lowest concentration of SARS-CoV-2 viral copies this assay can detect is 138 copies/mL. A negative result does not preclude SARS-Cov-2 infection and should not be used as the sole basis  for treatment or other patient management decisions. A negative result may occur with  improper specimen collection/handling, submission of specimen other than nasopharyngeal swab, presence of viral mutation(s) within the areas targeted by this assay, and inadequate number of viral copies(<138 copies/mL). A negative result must be combined with clinical observations, patient history, and epidemiological information. The expected result is Negative.  Fact Sheet for Patients:  BloggerCourse.com  Fact Sheet for Healthcare Providers:  SeriousBroker.it  This test is no t yet approved or cleared by the [place] FDA and  has been authorized for detection and/or diagnosis of SARS-CoV-2 by FDA under an Emergency Use Authorization (EUA). This EUA will remain  in effect (meaning this test can be used) for the duration of the COVID-19 declaration under Section 564(b)(1) of the Act, 21 U.S.C.section 360bbb-3(b)(1), unless the authorization is terminated  or revoked sooner.       Influenza A by PCR NEGATIVE NEGATIVE Final   Influenza B by PCR NEGATIVE NEGATIVE Final    Comment: (NOTE) The Xpert Xpress SARS-CoV-2/FLU/RSV plus assay is intended as an aid in the diagnosis of influenza from Nasopharyngeal swab specimens and should not be used as a sole basis  for treatment. Nasal washings and aspirates are unacceptable for Xpert Xpress SARS-CoV-2/FLU/RSV testing.  Fact Sheet for Patients: BloggerCourse.com  Fact Sheet for Healthcare Providers: SeriousBroker.it  This test is not yet approved or cleared by the [place] FDA and has been authorized for detection and/or diagnosis of SARS-CoV-2 by FDA under an Emergency Use Authorization (EUA). This EUA will remain in effect (meaning this test can be used) for the duration of the COVID-19 declaration under Section 564(b)(1) of the Act, 21 U.S.C. section 360bbb-3(b)(1), unless the authorization is terminated or revoked.  Performed at [facility], [address] [place], [place] [address]     Lab Basic Metabolic Panel: Recent Labs  Lab 01/13/2021 2027 01/21/2021 2037 01/30/2021 2111  NA 131* 131* 131*  K 4.2 4.2 3.8  CL 98 102  --   CO2 18*  --   --   GLUCOSE 294* 297*  --   BUN 17 18  --   CREATININE 1.01* 0.70  --   CALCIUM 8.1*  --   --    Liver Function Tests: Recent Labs  Lab 01/10/2021 2027  AST 55*  ALT 37  ALKPHOS 51  BILITOT 0.8  PROT 6.2*  ALBUMIN 3.5   No results for input(s): LIPASE, AMYLASE in the last 168 hours. No results for input(s): AMMONIA in the last 168 hours. CBC: Recent Labs  Lab 01/19/2021 2027 02/02/2021 2037 01/19/2021 2111  WBC 18.1*  --   --   HGB 12.0 11.9* 9.5*  HCT 36.1 35.0* 28.0*  MCV 95.8  --   --   PLT 343  --   --    Cardiac Enzymes: No results for input(s): CKTOTAL, CKMB, CKMBINDEX, TROPONINI in the last 168 hours. Sepsis Labs: Recent Labs  Lab 02/03/2021 2027  WBC 18.1*  LATICACIDVEN 4.5*    Procedures/Operations  None 80   Theona Muhs Chirag Redell Bhandari 24-Jan-2021, 12:28 PM

## 2021-02-04 NOTE — ED Notes (Addendum)
Communication with MD for orders for comfort care.

## 2021-02-04 NOTE — ED Notes (Signed)
The patients son Molly Maduro called back to the ER. Spoke with this RN verified that he has the number to patient placement and he as already communicated with them about funeral information.

## 2021-02-04 NOTE — ED Notes (Signed)
Time of death 11:27

## 2021-02-04 DEATH — deceased

## 2022-03-27 IMAGING — CT CT HEAD W/O CM
3 of 4 series · 12 of 47 positions shown, 14 images · IV contrast (agent unspecified)
Comparison: None.

CLINICAL DATA: Polytrauma, critical, head/C-spine injury suspected.

Fall, head injury, found down, unresponsive
EXAM:
CT HEAD WITHOUT CONTRAST
CT MAXILLOFACIAL WITHOUT CONTRAST
CT CERVICAL SPINE WITHOUT CONTRAST
CT CHEST, ABDOMEN AND PELVIS WITH CONTRAST
TECHNIQUE: Contiguous axial images were obtained from the base of the skull
through the vertex without intravenous contrast.

[Series 3: head wo · axial · 0.45mm/px · z∈[+1156,+1281]mm · 7 of 35 slices shown, 9 images]
[im 5/35  brain]
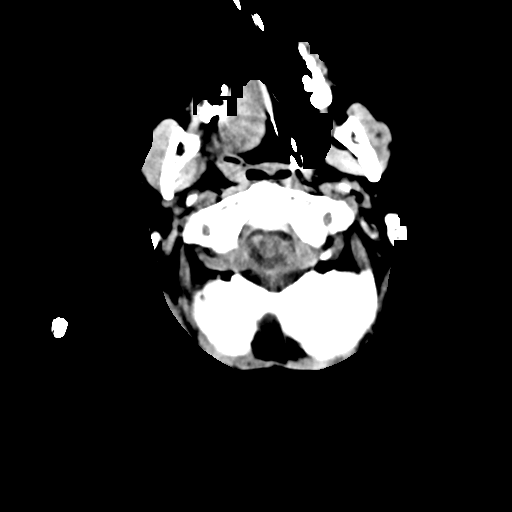
[im 5/35  bone]
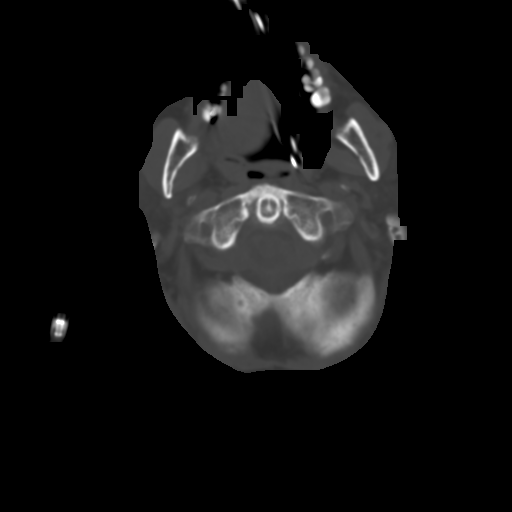
[im 9/35  brain]
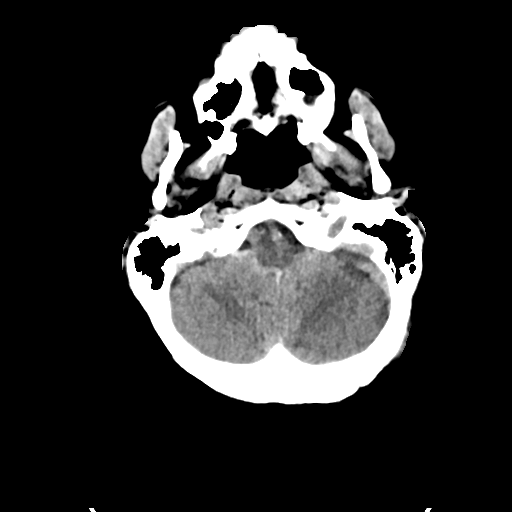
[im 13/35  brain]
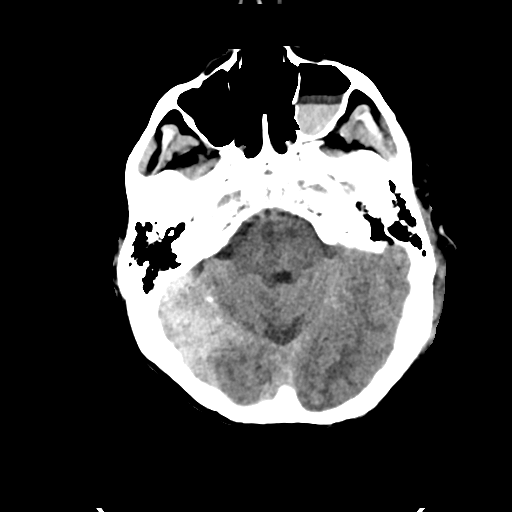
[im 18/35  brain]
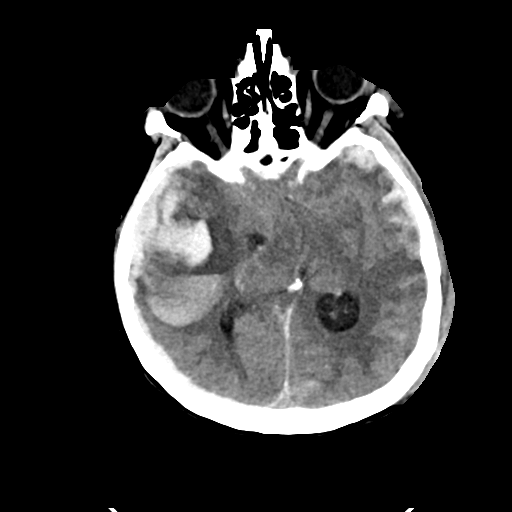
[im 22/35  brain]
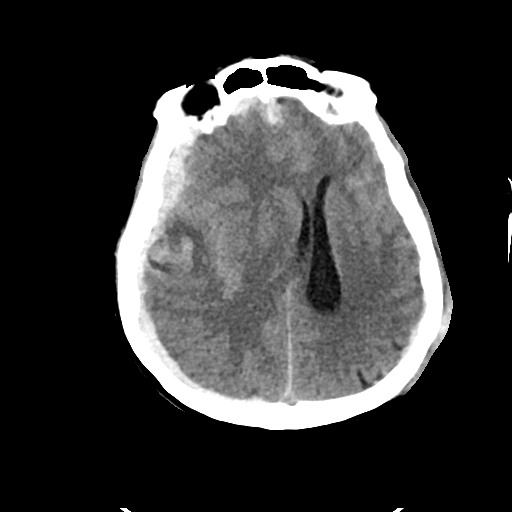
[im 22/35  bone]
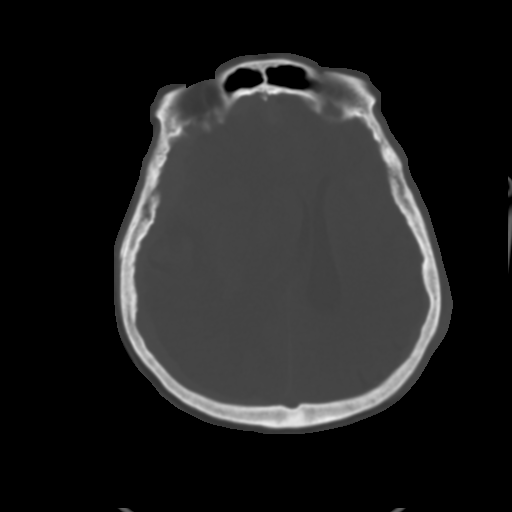
[im 26/35  brain]
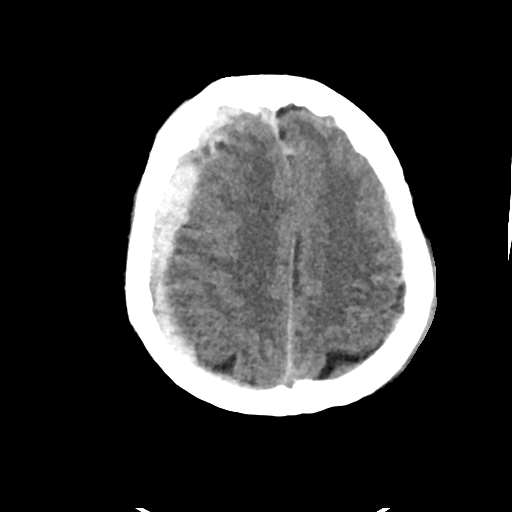
[im 30/35  brain]
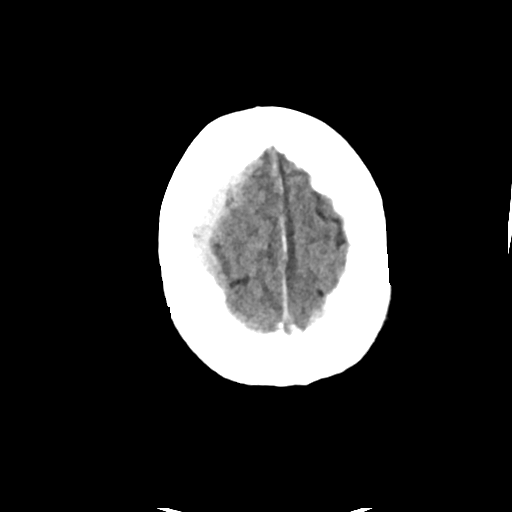

[Series 4: head bone · axial · 0.45mm/px · z∈[+1152,+1170]mm · 2 of 88 slices shown]
[im 9/88  bone]
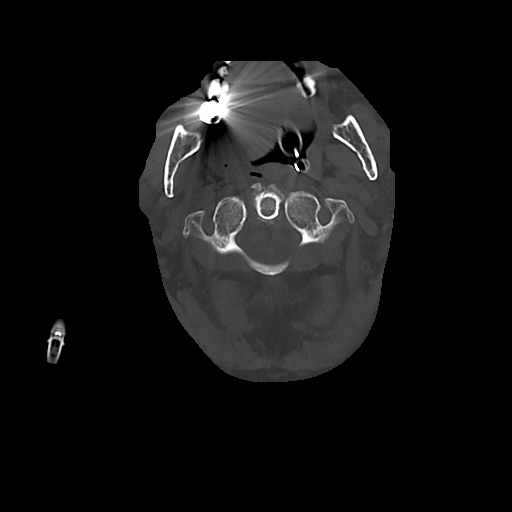
[im 18/88  bone]
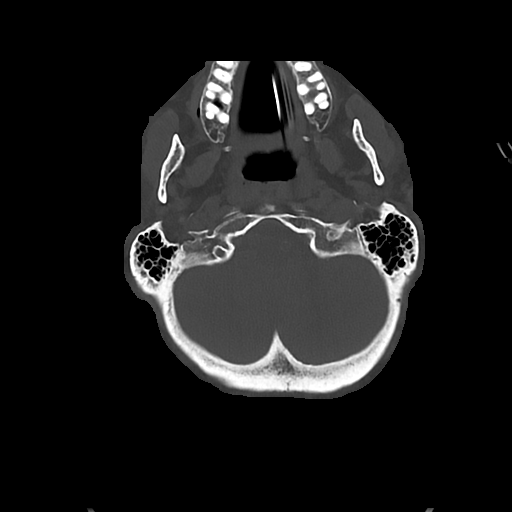

[Series 5: cor soft · coronal · 0.34mm/px · 3 of 65 slices shown]
[im 22/65  brain]
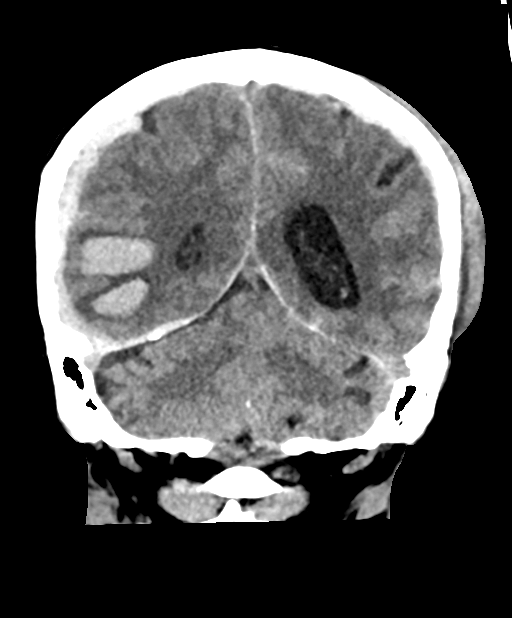
[im 29/65  brain]
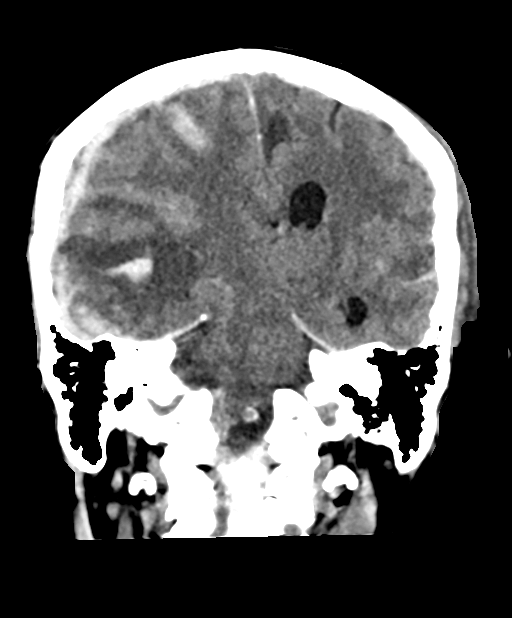
[im 36/65  brain]
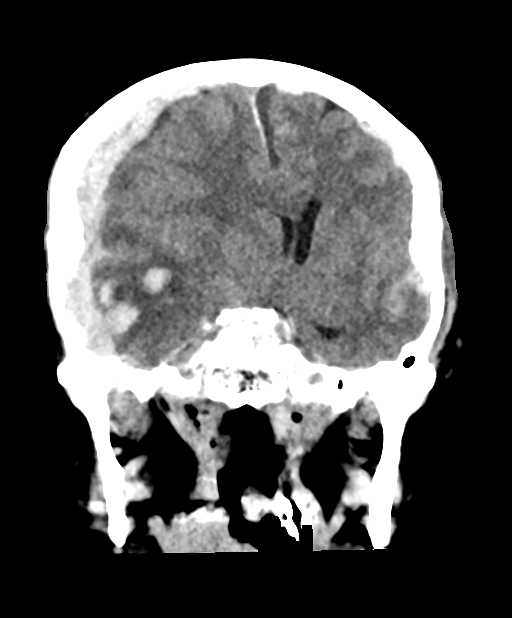

[12 of 47 positions shown; findings below may reference images not displayed]

Multidetector CT imaging of the maxillofacial structures was
performed. Multiplanar CT image reconstructions were also generated.
A small metallic BB was placed on the right temple in order to
reliably differentiate right from left.

Multidetector CT imaging of the cervical spine was performed without
intravenous contrast. Multiplanar CT image reconstructions were also
generated.

Multidetector CT imaging of the chest, abdomen and pelvis was
performed following the standard protocol during bolus
administration of intravenous contrast.

CONTRAST:  100mL OMNIPAQUE IOHEXOL 300 MG/ML  SOLN
FINDINGS: CT HEAD FINDINGS

Brain: There is a large right intraparenchymal hematoma involving
the a right temporal lobe anteriorly measuring at least 7.2 x 4.3 x
5.1 cm demonstrating significant mass effect with complete
effacement of the right lateral ventricle, 14 mm right to left
midline shift, and herniation of the uncus below the tentorium, best
appreciated on coronal image # 37. There is significant mass effect
upon the midbrain, best appreciated on axial image # [DATE].

Smaller 12 mm intraparenchymal hematoma is seen involving the
anterior pole of the left temporal lobe. There is extensive
subarachnoid hemorrhage noted bilaterally within the sulci of the
cerebral hemispheres. Bilateral subdural hematoma overlie the
cerebral convexities. On the right, this measures up to 13 mm in
thickness (coronal image # [DATE]), layers along the right tentorium,
and demonstrates additional mass effect upon the right cerebral
hemisphere. On the left, this is relatively thin measuring up to 3
mm in thickness.

Left lateral ventricle is of normal size. There is marked mass
effect with near complete effacement of the third ventricle. Fourth
ventricle is unremarkable. Complete effacement of the suprasellar
cistern. Cerebellum is unremarkable.

Vascular: No asymmetric hyperdense vasculature at the skull base.

Skull: The calvarium is intact.

Other: There is a a moderate left parietal scalp hematoma noted.
Mastoid air cells and middle ear cavities are clear.

CT MAXILLOFACIAL FINDINGS

Osseous: No acute facial fracture.  No mandibular dislocation.

Orbits: The ocular globes are intact. The ocular lenses are removed
bilaterally. The retro-orbital fat is preserved. Extraocular
musculature and optic nerves appear normal. There is moderate left
preseptal soft tissue swelling.

Sinuses: There is layering high attenuation fluid within the left
maxillary sinus and sphenoid sinuses in keeping with blood.

Soft tissues: Unremarkable.

CT CERVICAL SPINE FINDINGS

Alignment: Normal.  No listhesis.

Skull base and vertebrae: Craniocervical alignment is normal. The
atlantodental interval is not widened. There is no acute fracture of
the cervical spine. Vertebral body height is preserved.

Soft tissues and spinal canal: No prevertebral fluid or swelling. No
visible canal hematoma.

Disc levels: There is intervertebral disc space narrowing and
endplate remodeling at C5-C7 in keeping with changes of moderate
degenerative disc disease. Remaining intervertebral disc heights are
preserved. The prevertebral soft tissues are not thickened on
sagittal reformats. Multilevel uncovertebral and facet arthrosis
results in multilevel mild-to-moderate neuroforaminal narrowing,
most severe on the left at C4-5 and bilaterally at C5-6

Other: None

CT CHEST FINDINGS

Cardiovascular: No significant coronary artery calcification. Global
cardiac size within normal limits. No pericardial effusion. Central
pulmonary arteries are of normal caliber. Thoracic aorta is
unremarkable.

Mediastinum/Nodes: Endotracheal tube in expected position within the
distal trachea. Nasogastric tube extends into the mid body of the
stomach. No pathologic adenopathy within the thorax. No mediastinal
hematoma. Esophagus unremarkable.

Lungs/Pleura: Mild bibasilar atelectasis. Lungs are otherwise clear.
No pneumothorax or pleural effusion.

Musculoskeletal: Degenerative changes noted within the shoulders.
Multiple healed right rib fractures noted. No acute bone abnormality
within the thorax.

CT ABDOMEN AND PELVIS FINDINGS

Hepatobiliary: Status post cholecystectomy. Moderate intra and
extrahepatic biliary ductal dilation may represent post
cholecystectomy change, and is stable since remote prior examination
of 10/15/2019. Liver unremarkable.

Pancreas: Unremarkable

Spleen: Unremarkable

Adrenals/Urinary Tract: Adrenal glands are unremarkable. Kidneys are
normal, without renal calculi, focal lesion, or hydronephrosis.
Bladder is unremarkable.

Stomach/Bowel: The stomach, small bowel, and large bowel are
unremarkable. No free intraperitoneal gas or fluid.

Vascular/Lymphatic: Retroaortic left renal vein. Mild
atherosclerotic calcification within the abdominal aorta. No aortic
aneurysm. No pathologic adenopathy within the abdomen and pelvis.

Reproductive: Uterus and bilateral adnexa are unremarkable.

Other: No abdominal wall hernia

Musculoskeletal: Degenerative changes are seen within the lumbar
spine. Remote compression fracture of L2 status post vertebroplasty
noted. No acute bone abnormality within the abdomen and pelvis.
IMPRESSION: Probable Coup/contrecoup type injury with direct impact and moderate
hematoma involving the left parietal scalp. Large intraparenchymal
hematoma within the right temporal lobe measuring up to 7.2 cm in
size demonstrating marked mass effect with 14 mm right to left
midline shift, herniation of the uncus with effacement of the
suprasellar cistern and marked mass effect upon the midbrain,
complete effacement of the right lateral ventricle and near complete
effacement of the third ventricle.

Superimposed bilateral subdural hematoma measuring up to 13 mm in
thickness on the right contributing to mass effect upon the right
cerebral hemisphere. Small left subdural hematoma measures up to 3
mm in thickness without associated significant mass effect.

12 mm intraparenchymal hematoma within the anterior pole of the left
temporal lobe.

Extensive subarachnoid hemorrhage.

Layering hemorrhage within the paranasal sinuses. No acute facial
fracture identified, however.

No acute fracture or listhesis of the cervical spine.

No acute intrathoracic or intra-abdominal injury.

These results were called by telephone at the time of interpretation
on 01/05/2021 at [DATE] to provider SAIMARIE MALVET , who verbally
acknowledged these results.
